# Patient Record
Sex: Male | Born: 1965 | Race: White | Hispanic: No | Marital: Married | Smoking: Never smoker
Health system: Southern US, Community
[De-identification: ages and names within clinical notes are randomized; demographics above are authoritative.]

## PROBLEM LIST (undated history)

## (undated) DIAGNOSIS — L98499 Non-pressure chronic ulcer of skin of other sites with unspecified severity: Secondary | ICD-10-CM

## (undated) DIAGNOSIS — K219 Gastro-esophageal reflux disease without esophagitis: Secondary | ICD-10-CM

## (undated) DIAGNOSIS — M1990 Unspecified osteoarthritis, unspecified site: Secondary | ICD-10-CM

## (undated) DIAGNOSIS — C44310 Basal cell carcinoma of skin of unspecified parts of face: Secondary | ICD-10-CM

## (undated) HISTORY — DX: Unspecified osteoarthritis, unspecified site: M19.90

## (undated) HISTORY — PX: ESOPHAGEAL DILATION: SHX303

## (undated) HISTORY — DX: Non-pressure chronic ulcer of skin of other sites with unspecified severity: L98.499

## (undated) HISTORY — DX: Basal cell carcinoma of skin of unspecified parts of face: C44.310

## (undated) HISTORY — PX: UPPER GASTROINTESTINAL ENDOSCOPY: SHX188

## (undated) HISTORY — PX: HERNIA REPAIR: SHX51

## (undated) HISTORY — DX: Gastro-esophageal reflux disease without esophagitis: K21.9

## (undated) HISTORY — PX: WISDOM TOOTH EXTRACTION: SHX21

---

## 2005-12-20 ENCOUNTER — Encounter: Admission: RE | Admit: 2005-12-20 | Discharge: 2005-12-20 | Payer: Self-pay | Admitting: Family Medicine

## 2006-01-21 ENCOUNTER — Ambulatory Visit (HOSPITAL_COMMUNITY): Admission: RE | Admit: 2006-01-21 | Discharge: 2006-01-21 | Payer: Self-pay | Admitting: Gastroenterology

## 2008-08-03 ENCOUNTER — Emergency Department (HOSPITAL_COMMUNITY): Admission: EM | Admit: 2008-08-03 | Discharge: 2008-08-03 | Payer: Self-pay | Admitting: *Deleted

## 2008-08-03 ENCOUNTER — Emergency Department (HOSPITAL_COMMUNITY): Admission: EM | Admit: 2008-08-03 | Discharge: 2008-08-03 | Payer: Self-pay | Admitting: Emergency Medicine

## 2009-01-04 IMAGING — CT CT ABDOMEN W/ CM
2 of 5 series · 17 of 46 positions shown, 19 images · IV contrast (agent unspecified)
Comparison: None available.

CT ABDOMEN

CLINICAL DATA: Abdominal pain, vomiting.  Right lower quadrant
pain and elevated white count.

CT ABDOMEN AND PELVIS WITH CONTRAST
TECHNIQUE: Multidetector CT imaging of the abdomen and pelvis was
performed using the standard protocol following bolus
administration of intravenous contrast.
Contrast: 100 ml 1mnipaque-WQQ.

[Series 2: abd_pel 5.0 b40f st · axial · 0.63mm/px · z∈[-608,-228]mm · 14 of 86 slices shown, 16 images]
[im 5/86  soft-tissue]
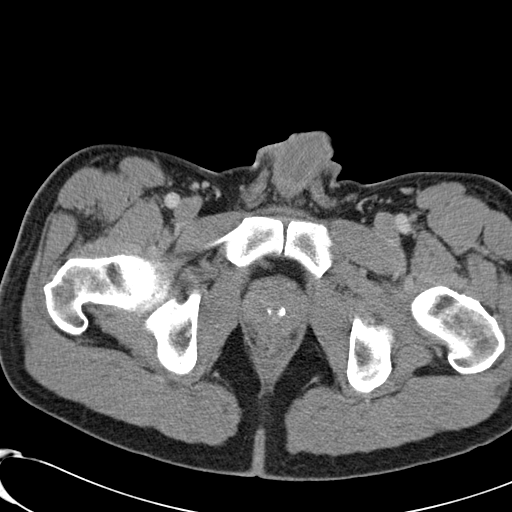
[im 5/86  bone]
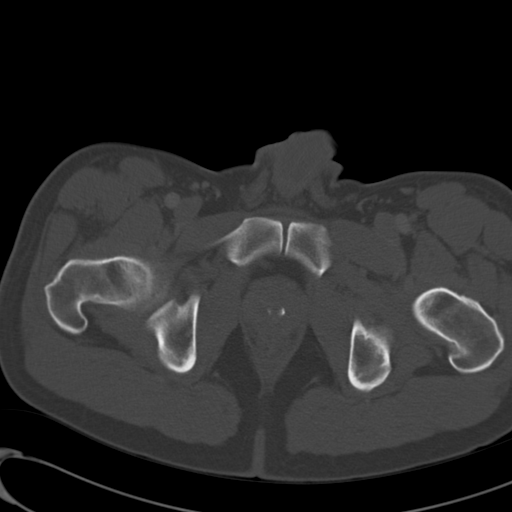
[im 9/86  soft-tissue]
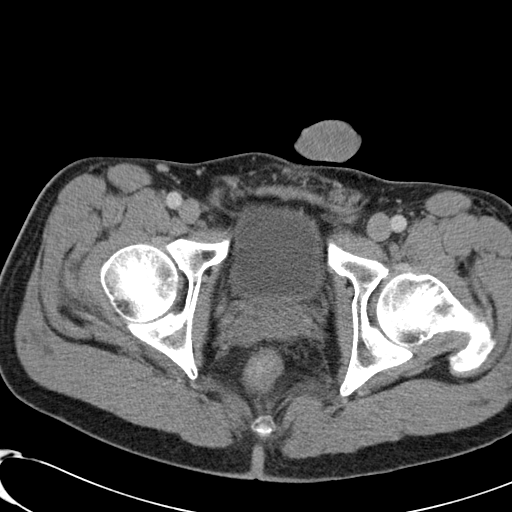
[im 18/86  soft-tissue]
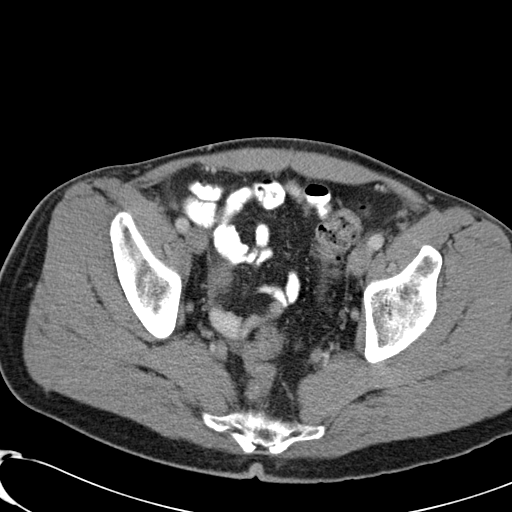
[im 23/86  soft-tissue]
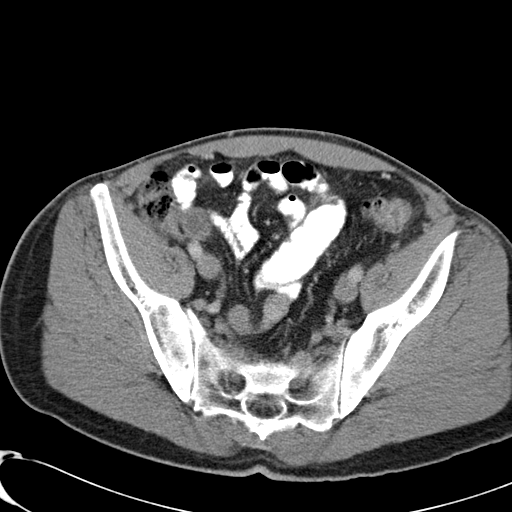
[im 27/86  soft-tissue]
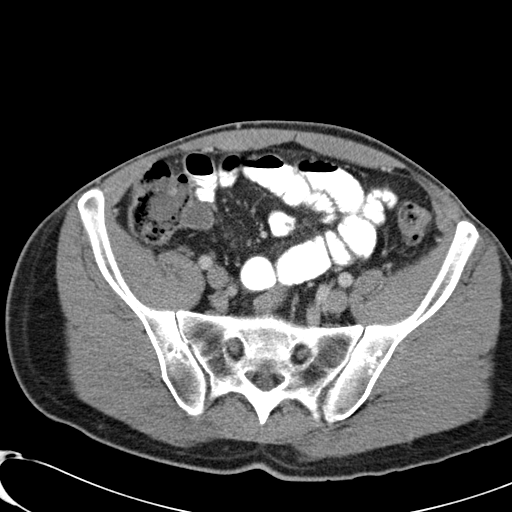
[im 36/86  soft-tissue]
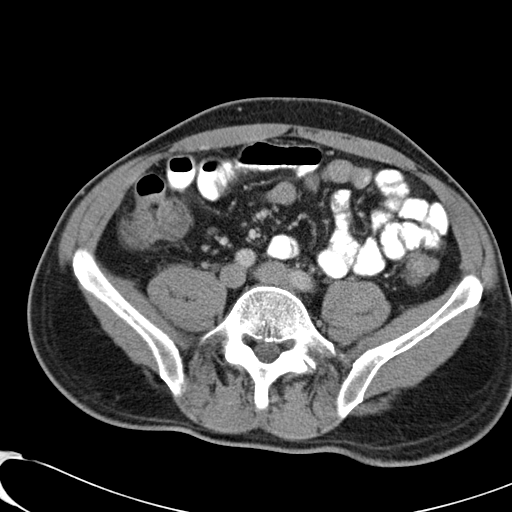
[im 41/86  soft-tissue]
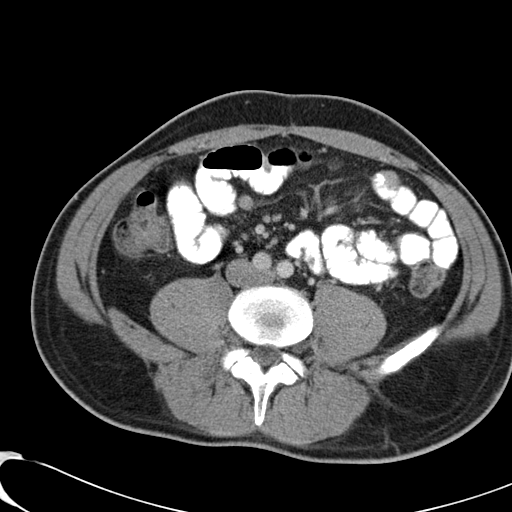
[im 45/86  soft-tissue]
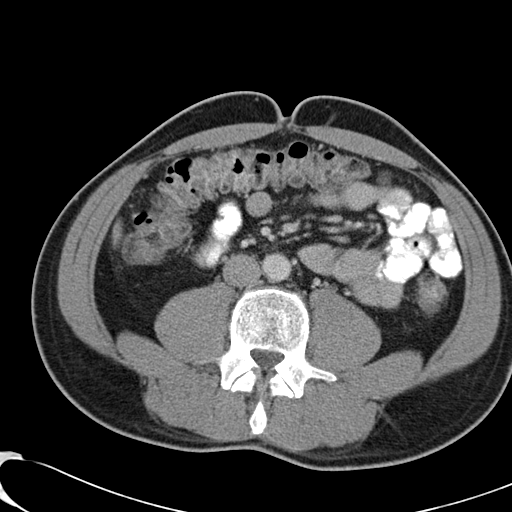
[im 50/86  soft-tissue]
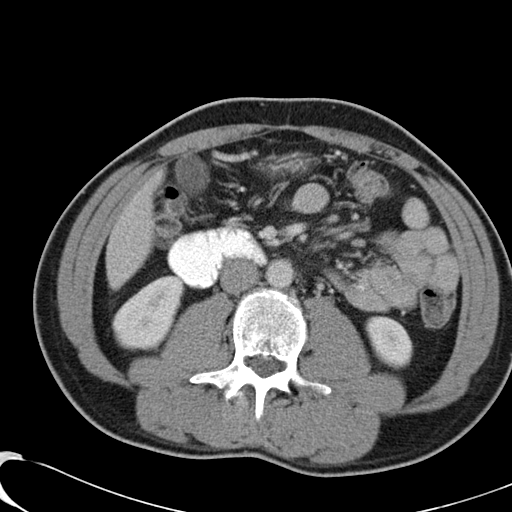
[im 50/86  bone]
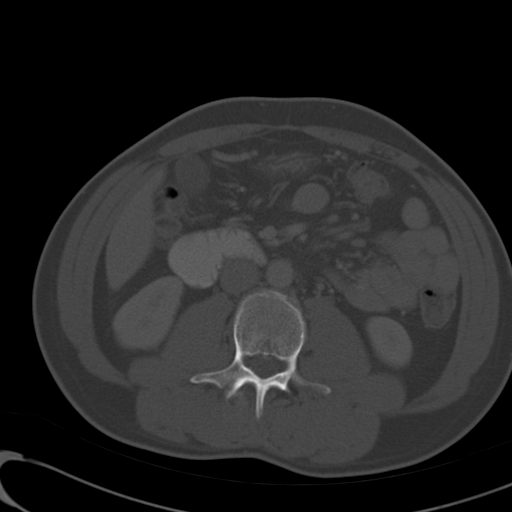
[im 59/86  soft-tissue]
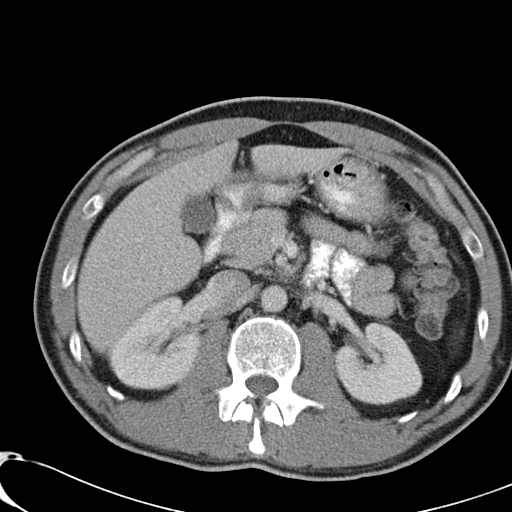
[im 63/86  soft-tissue]
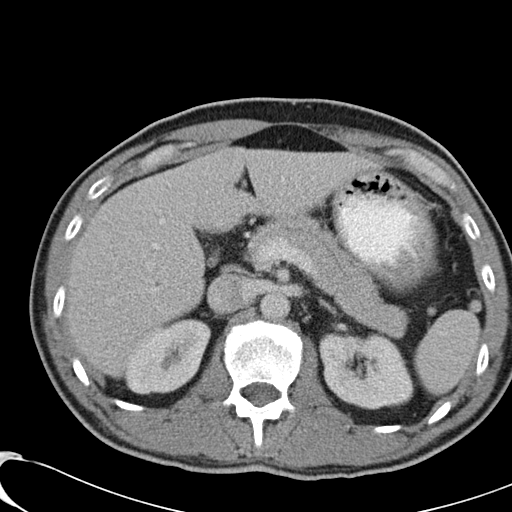
[im 68/86  soft-tissue]
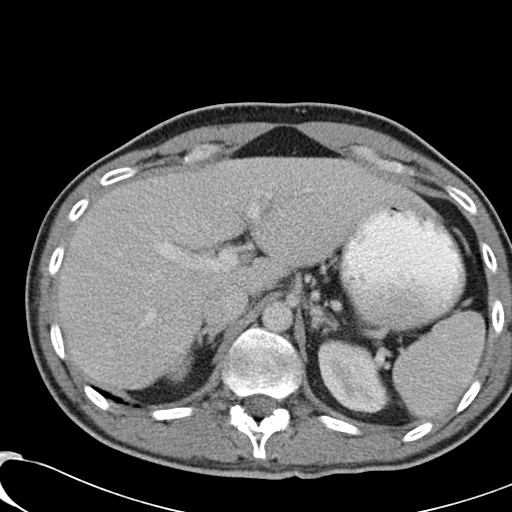
[im 77/86  soft-tissue]
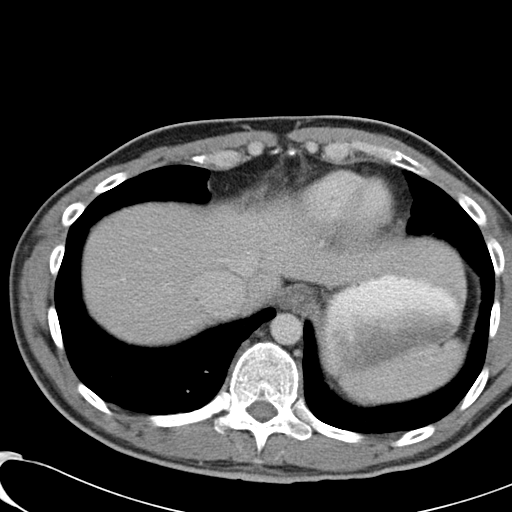
[im 81/86  soft-tissue]
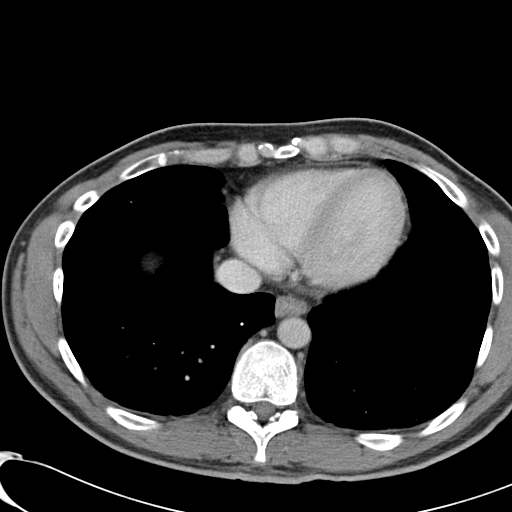

[Series 602: <mpr thick range> · coronal · 0.87mm/px · 3 of 67 slices shown]
[im 23/67  soft-tissue]
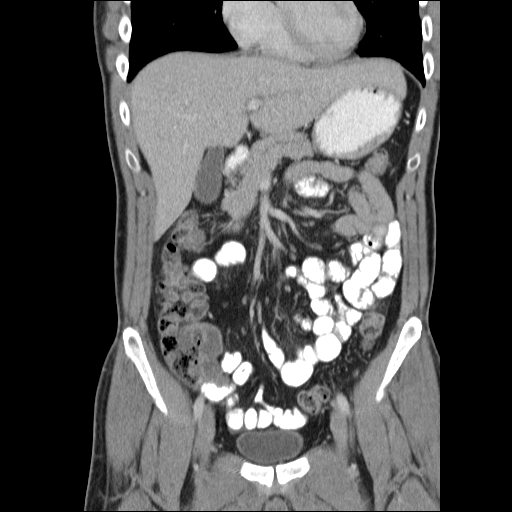
[im 30/67  soft-tissue]
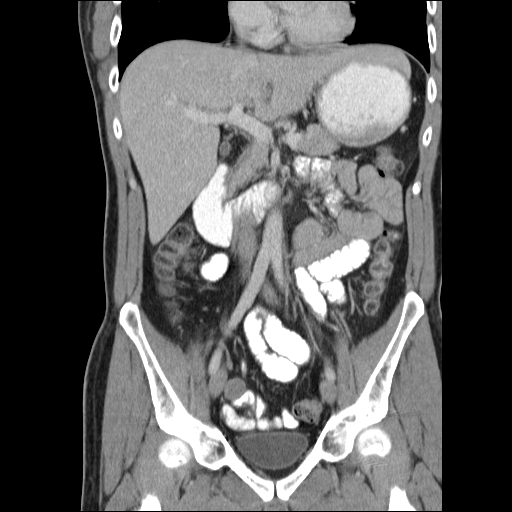
[im 37/67  soft-tissue]
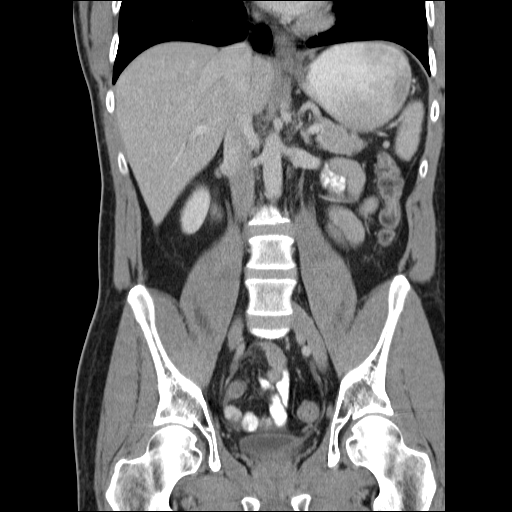

[17 of 46 positions shown; findings below may reference images not displayed]

FINDINGS: There is some dependent atelectatic change in the lung
bases. Calcified granuloma left lower lobe noted.  No pleural or
pericardial effusion.

The liver, gallbladder, biliary tree, adrenal glands, spleen,
pancreas and kidneys all appear normal.  Stomach and small bowel
are normal in appearance.  There is no abdominal lymphadenopathy or
fluid collection.  No focal bony abnormality.
IMPRESSION: Negative exam.

CT PELVIS
FINDINGS: The appendix is visualized and normal.  There is a
moderate volume of free fluid in the pelvis.  Source for this fluid
is not identified.  Colon is unremarkable in appearance.  No
lymphadenopathy.  Prostate calcifications noted.  Small sclerotic
lesion in the left iliac wing likely reflects a bone island.  Bones
otherwise unremarkable.
IMPRESSION: Free pelvic fluid is noted.  Source of fluid is not identified. If
the patient's symptoms persist, consider rescanning and 12-24
hours.

## 2011-05-10 NOTE — Op Note (Signed)
NAMEACHILLE, Dustin Fischer                 ACCOUNT NO.:  [id number]   MEDICAL RECORD NO.:  [id number]          PATIENT TYPE:  AMB   LOCATION:  ENDO                         FACILITY:  [facility]   PHYSICIAN:  Danise Edge, M.D.   DATE OF BIRTH:  May 03, 1966   DATE OF PROCEDURE:  01/21/2006  DATE OF DISCHARGE:                                 OPERATIVE REPORT   PROCEDURE:  Esophagogastroduodenoscopy and Savary esophageal dilation.   REFERRING PHYSICIAN:  Jethro Bastos, M.D.   INDICATIONS:  [name] is a 44 year old male born 28-Jan-1966. Mr.  Dustin Fischer has a history of peptic ulcer disease and was treated for H. pylori  antral gastritis. He takes Zantac p.r.n. for dyspepsia. 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   ENDOSCOPIST:  Danise Edge, M.D.   PREMEDICATION:  Versed 10 mg, Demerol 50 mg.   DESCRIPTION OF PROCEDURE:  After obtaining informed consent, [name] was  placed in the left lateral decubitus position on the fluoroscopy table. I  administered intravenous Demerol and intravenous Versed to achieve conscious  sedation for the procedure. The patient's blood pressure, oxygen saturation  and cardiac rhythm were monitored throughout the procedure and documented in  the medical record.   The Olympus gastroscope was passed through the posterior hypopharynx into  the proximal esophagus without difficulty. The hypopharynx, larynx and vocal  cords appeared completely normal.   ESOPHAGOSCOPY:  The proximal and mid segments of the esophageal mucosa  appeared normal. There is a benign appearing stricture at the  esophagogastric junction which is noted at approximately 44 cm from the  incisor teeth. Mucosa in the distal esophagus and overlying the distal  esophageal stricture  appeared normal. There was no endoscopic evidence for  the presence of erosive esophagitis or Barrett's esophagus. The stricture  was too tight to allow the gastroscope to traverse the stricture and enter  the stomach. Under fluoroscopic guidance, the guidewire was deployed at the  esophagogastric junction through the stricture and advanced to the distal  gastric antrum. Under fluoroscopic guidance, the 10 mm, 11 mm and 12.5 mm  dilators were passed. The guidewire was removed. The endoscope was again  passed through the posterior hypopharynx into the proximal esophagus without  difficulty. I was easily able to traverse the benign-appearing peptic  stricture at the esophagogastric junction. There was a linear tear at the  esophagogastric junction due to esophageal dilation. There was a small  amount of bleeding. The stricture appeared adequately dilated and due to the  presence of the linear tear I elected not to pass any further dilators.   GASTROSCOPY:  Retroflexed view of the gastric cardia and fundus was normal.  The gastric body, antrum and pylorus appeared normal. A biopsy was taken  from the distal gastric antrum for CLO-test to confirm eradication of the H.  pylori antral gastritis.   DUODENOSCOPY:  The duodenal bulb appeared normal. There  was mild resistance  to passage of the duodenoscope into the second portion of the duodenum,  probably due to a stricture from previous peptic ulcer disease. The duodenal  bulb and mucosa in the second portion of the duodenum appeared normal. There  was bleeding at the junction of the duodenal bulb and second portion of the  duodenum as a result of the gastroscope dilating the duodenal stricture.   ASSESSMENT:  1.  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 Due to esophageal dilation, a linear      tear was present at the esophagogastric junction and I elected not to       pass any further dilators during this setting.  2.  Previous peptic ulcer disease leading to a mild stricture at the distal      duodenal bulb which was partially dilated with the gastroscope as I      entered the second portion of the duodenum.  3.  CLO-test to rule out H. pylori gastritis pending.           ______________________________  Danise Edge, M.D.     MJ/MEDQ  D:  01/21/2006  T:  01/21/2006  Job:  [id number]   cc:   Jethro Bastos, M.D.  Fax: [phone]

## 2011-09-20 LAB — POCT I-STAT, CHEM 8
Calcium, Ion: 1.22
Chloride: 106
Creatinine, Ser: 1.1
Creatinine, Ser: 1.2
Glucose, Bld: 141 — ABNORMAL HIGH
Glucose, Bld: 86
HCT: 47
Hemoglobin: 14.3
Potassium: 8.5
Sodium: 142
TCO2: 30

## 2011-09-20 LAB — COMPREHENSIVE METABOLIC PANEL
ALT: 22
Alkaline Phosphatase: 52
CO2: 29
Glucose, Bld: 144 — ABNORMAL HIGH
Potassium: 3.5
Sodium: 142
Total Protein: 7.1

## 2011-09-20 LAB — CBC
HCT: 40.5
Hemoglobin: 13.3
MCHC: 32.9
MCV: 94.6
RBC: 4.28
RBC: 4.65
WBC: 17.8 — ABNORMAL HIGH

## 2011-09-20 LAB — DIFFERENTIAL
Lymphs Abs: 2
Monocytes Relative: 6
Neutro Abs: 14.5 — ABNORMAL HIGH
Neutrophils Relative %: 82 — ABNORMAL HIGH

## 2011-09-20 LAB — URINALYSIS, ROUTINE W REFLEX MICROSCOPIC
Glucose, UA: NEGATIVE
Leukocytes, UA: NEGATIVE
Protein, ur: NEGATIVE

## 2014-08-31 ENCOUNTER — Ambulatory Visit (INDEPENDENT_AMBULATORY_CARE_PROVIDER_SITE_OTHER): Payer: BC Managed Care – PPO | Admitting: Emergency Medicine

## 2014-08-31 VITALS — BP 120/84 | HR 57 | Temp 98.1°F | Resp 16 | Ht 73.0 in | Wt 171.2 lb

## 2014-08-31 DIAGNOSIS — S91309A Unspecified open wound, unspecified foot, initial encounter: Secondary | ICD-10-CM

## 2014-08-31 DIAGNOSIS — S91312A Laceration without foreign body, left foot, initial encounter: Secondary | ICD-10-CM

## 2014-08-31 DIAGNOSIS — Z23 Encounter for immunization: Secondary | ICD-10-CM

## 2014-08-31 NOTE — Progress Notes (Signed)
[facility] [address], [place] [address] [phone]  Date:  08/31/2014   Name:  EVYN PUTZIER   DOB:  09-14-1966   MRN:  [id number]  PCP:  No PCP Per Patient    Chief Complaint: Hernia   History of Present Illness:  NAVDEEP HALT is a 48 y.o. very pleasant male patient who presents with the following:  [place]  Has a laceration of foot. Concerned about two skin lesions on left arm.  Present for years Concerned that he has pain intermittently in his right groin.  Worse when runs.  No history of injury or overuse. No improvement with over the counter medications or other home remedies.  Denies other complaint or health concern today.  .   There are no active problems to display for this patient.   Past Medical History  Diagnosis Date  . Ulcer of abdomen wall     No past surgical history on file.  History  Substance Use Topics  . Smoking status: Never Smoker   . Smokeless tobacco: Never Used  . Alcohol Use: No    Family History  Problem Relation Age of Onset  . Heart failure Mother     Allergies  Allergen Reactions  . Aspirin     Internal bleeding as a child    Medication list has been reviewed and updated.  No current outpatient prescriptions on file prior to visit.   No current facility-administered medications on file prior to visit.    Review of Systems:  As per HPI, otherwise negative.   Physical Examination: Filed Vitals:   08/31/14 2042  BP: 120/84  Pulse: 57  Temp: 98.1 F (36.7 C)  Resp: 16   Filed Vitals:   08/31/14 2042  Height: 6\' 1"  (1.854 m)  Weight: 171 lb 3.2 oz (77.656 kg)   Body mass index is 22.59 kg/(m^2). Ideal Body Weight: Weight in (lb) to have BMI = 25: 189.1   GEN: WDWN, NAD, Non-toxic, Alert & Oriented x 3 HEENT: Atraumatic, Normocephalic.  Ears and Nose: No external deformity. EXTR: No clubbing/cyanosis/edema NEURO: Normal gait.  PSYCH:  Normally interactive. Conversant. Not depressed or anxious appearing.  Calm demeanor.  LEFT foot:  Superficial skin tear on foot  NATI  No FB GROIN:  No hernia    Assessment and Plan: Laceration foot TD  Signed,  Ellison Carwin, MD

## 2014-08-31 NOTE — Patient Instructions (Signed)

## 2015-11-21 ENCOUNTER — Encounter: Payer: Self-pay | Admitting: *Deleted

## 2015-11-21 ENCOUNTER — Emergency Department
Admission: EM | Admit: 2015-11-21 | Discharge: 2015-11-21 | Disposition: A | Discharge status: Home / Self Care | Payer: No Typology Code available for payment source | Source: Home / Self Care | Attending: Family Medicine | Admitting: Family Medicine

## 2015-11-21 DIAGNOSIS — J020 Streptococcal pharyngitis: Secondary | ICD-10-CM

## 2015-11-21 LAB — POCT RAPID STREP A (OFFICE): Rapid Strep A Screen: POSITIVE — AB

## 2015-11-21 MED ORDER — ACETAMINOPHEN 325 MG PO TABS
975.0000 mg | ORAL_TABLET | Freq: Once | ORAL | Status: AC
  Administered 2015-11-21: 975 mg via ORAL

## 2015-11-21 MED ORDER — CEFTRIAXONE SODIUM 1 G IJ SOLR
1.0000 g | Freq: Once | INTRAMUSCULAR | Status: AC
  Administered 2015-11-21: 1 g via INTRAMUSCULAR

## 2015-11-21 MED ORDER — AMOXICILLIN 400 MG/5ML PO SUSR: 500.0000 mg | Freq: Two times a day (BID) | ORAL | Status: DC

## 2015-11-21 NOTE — Discharge Instructions (Signed)
°  You may take 500mg Tylenol every 4-6 hours as needed for fever and pain  °Follow-up with your primary care provider next week for recheck of symptoms if not improving.  °Be sure to drink plenty of fluids and rest, at least 8hrs of sleep a night, preferably more while you are sick. °Return urgent care or go to closest ER if you cannot keep down fluids/signs of dehydration, fever not reducing with Tylenol, difficulty breathing/wheezing, stiff neck, worsening condition, or other concerns (see below)  °Please take antibiotics as prescribed and be sure to complete entire course even if you start to feel better to ensure infection does not come back. ° °

## 2015-11-21 NOTE — ED Notes (Signed)
Pt c/o sore throat, HA, and fever x 1 day. Took tylenol at 1300.

## 2015-11-21 NOTE — ED Provider Notes (Signed)
CSN: [id number]     Arrival date & time 11/21/15  1828 History   First MD Initiated Contact with Patient 11/21/15 1839     Chief Complaint  Patient presents with  . Sore Throat   (Consider location/radiation/quality/duration/timing/severity/associated sxs/prior Treatment) HPI Pt is a 49yo male presenting to [facility] with c/o gradually worsening sore throat with associated fatigue, fever Tmax 103.5 F, and body aches that started yesterday.  Pain is aching and sore, worse with swallowing. Pain is minimal at this time.  Denies sick contacts or recent travel. Denies difficulty breathing and is able to keep down fluids. Pt states he cannot take ibuprofen due to stomach ulcers.   Past Medical History  Diagnosis Date  . Ulcer of abdomen wall [facility])    Past Surgical History  Procedure Laterality Date  . Hernia repair    . Wisdom tooth extraction     Family History  Problem Relation Age of Onset  . Heart failure Mother    Social History  Substance Use Topics  . Smoking status: Never Smoker   . Smokeless tobacco: Never Used  . Alcohol Use: No    Review of Systems  Constitutional: Positive for fever, chills, activity change and fatigue.  HENT: Positive for sore throat. Negative for congestion, ear pain, trouble swallowing and voice change.   Respiratory: Positive for cough. Negative for shortness of breath.   Cardiovascular: Negative for chest pain and palpitations.  Gastrointestinal: Positive for nausea. Negative for vomiting, abdominal pain and diarrhea.  Musculoskeletal: Positive for myalgias and arthralgias. Negative for back pain.  Skin: Negative for rash.  Neurological: Positive for dizziness and headaches. Negative for light-headedness.  All other systems reviewed and are negative.   Allergies  Aspirin and Ibuprofen  Home Medications   Prior to Admission medications   Medication Sig Start Date End Date Taking? Authorizing Provider  amoxicillin (AMOXIL) 400 MG/5ML suspension  Take 6.3 mLs (500 mg total) by mouth 2 (two) times daily. For 10 days 11/21/15   Noland Fordyce, PA-C  omeprazole (PRILOSEC OTC) 20 MG tablet Take 20 mg by mouth daily.    Historical Provider, MD   Meds Ordered and Administered this Visit   Medications  cefTRIAXone (ROCEPHIN) injection 1 g (1 g Intramuscular Given 11/21/15 1910)  acetaminophen (TYLENOL) tablet 975 mg (975 mg Oral Given 11/21/15 1850)    BP 132/81 mmHg  Pulse 97  Temp(Src) 103 F (39.4 C) (Oral)  Resp 18  Ht 6\' 1"  (1.854 m)  Wt 173 lb (78.472 kg)  BMI 22.83 kg/m2  SpO2 97% No data found.   Physical Exam  Constitutional: He appears well-developed and well-nourished.  HENT:  Head: Normocephalic and atraumatic.  Right Ear: Hearing, tympanic membrane, external ear and ear canal normal.  Left Ear: Hearing, tympanic membrane, external ear and ear canal normal.  Nose: Nose normal.  Mouth/Throat: Uvula is midline and mucous membranes are normal. No trismus in the jaw. Oropharyngeal exudate, posterior oropharyngeal edema and posterior oropharyngeal erythema present. No tonsillar abscesses.  Eyes: Conjunctivae are normal. No scleral icterus.  Neck: Normal range of motion. Neck supple.  Cardiovascular: Normal rate, regular rhythm and normal heart sounds.   Pulmonary/Chest: Effort normal and breath sounds normal. No stridor. No respiratory distress. He has no wheezes. He has no rales. He exhibits no tenderness.  Abdominal: Soft. Bowel sounds are normal. He exhibits no distension and no mass. There is no tenderness. There is no rebound and no guarding.  Musculoskeletal: Normal range of motion.  Lymphadenopathy:    He has cervical adenopathy.  Neurological: He is alert.  Skin: Skin is warm and dry.  Nursing note and vitals reviewed.   ED Course  Procedures (including critical care time)  Labs Review Labs Reviewed  POCT RAPID STREP A (OFFICE) - Abnormal; Notable for the following:    Rapid Strep A Screen Positive (*)     All other components within normal limits    Imaging Review No results found.    MDM   1. Streptococcal pharyngitis    Pt c/o sore throat, fever, body aches and headache.  Tonsillar erythema, edema and exudates noted on exam. No peritonsillar abscess at this time. Rapid strep: POSITIVE Pt requesting "shot" of antibiotic in UC to start treatment as soon as possible Rx: Amoxicillin liquid form as pt states it hurts to swallow. F/u with PCP in 7-10 days if not improving, sooner if worsening. Patient verbalized understanding and agreement with treatment plan.     Noland Fordyce, PA-C 11/21/15 1916

## 2016-08-17 ENCOUNTER — Ambulatory Visit (INDEPENDENT_AMBULATORY_CARE_PROVIDER_SITE_OTHER): Payer: Managed Care, Other (non HMO) | Admitting: Family Medicine

## 2016-08-17 ENCOUNTER — Ambulatory Visit (INDEPENDENT_AMBULATORY_CARE_PROVIDER_SITE_OTHER): Payer: Managed Care, Other (non HMO)

## 2016-08-17 VITALS — BP 122/72 | HR 58 | Temp 98.0°F | Resp 17 | Ht 73.5 in | Wt 169.0 lb

## 2016-08-17 DIAGNOSIS — K219 Gastro-esophageal reflux disease without esophagitis: Secondary | ICD-10-CM | POA: Diagnosis not present

## 2016-08-17 DIAGNOSIS — Z1329 Encounter for screening for other suspected endocrine disorder: Secondary | ICD-10-CM

## 2016-08-17 DIAGNOSIS — C4491 Basal cell carcinoma of skin, unspecified: Secondary | ICD-10-CM | POA: Insufficient documentation

## 2016-08-17 DIAGNOSIS — Z1211 Encounter for screening for malignant neoplasm of colon: Secondary | ICD-10-CM

## 2016-08-17 DIAGNOSIS — M545 Low back pain, unspecified: Secondary | ICD-10-CM

## 2016-08-17 DIAGNOSIS — Z13220 Encounter for screening for lipoid disorders: Secondary | ICD-10-CM

## 2016-08-17 DIAGNOSIS — Z131 Encounter for screening for diabetes mellitus: Secondary | ICD-10-CM | POA: Diagnosis not present

## 2016-08-17 DIAGNOSIS — Z114 Encounter for screening for human immunodeficiency virus [HIV]: Secondary | ICD-10-CM | POA: Diagnosis not present

## 2016-08-17 DIAGNOSIS — Z0000 Encounter for general adult medical examination without abnormal findings: Secondary | ICD-10-CM

## 2016-08-17 DIAGNOSIS — H9191 Unspecified hearing loss, right ear: Secondary | ICD-10-CM

## 2016-08-17 DIAGNOSIS — Z136 Encounter for screening for cardiovascular disorders: Secondary | ICD-10-CM

## 2016-08-17 DIAGNOSIS — H9190 Unspecified hearing loss, unspecified ear: Secondary | ICD-10-CM | POA: Insufficient documentation

## 2016-08-17 DIAGNOSIS — Z125 Encounter for screening for malignant neoplasm of prostate: Secondary | ICD-10-CM

## 2016-08-17 LAB — POCT URINALYSIS DIP (MANUAL ENTRY)
Bilirubin, UA: NEGATIVE
GLUCOSE UA: NEGATIVE
Ketones, POC UA: NEGATIVE
Leukocytes, UA: NEGATIVE
NITRITE UA: NEGATIVE
RBC UA: NEGATIVE
SPEC GRAV UA: 1.02
UROBILINOGEN UA: 0.2
pH, UA: 7

## 2016-08-17 LAB — CBC WITH DIFFERENTIAL/PLATELET
BASOS ABS: 60 {cells}/uL (ref 0–200)
BASOS PCT: 1 %
EOS ABS: 120 {cells}/uL (ref 15–500)
Eosinophils Relative: 2 %
HEMATOCRIT: 44.1 % (ref 38.5–50.0)
Hemoglobin: 14.6 g/dL (ref 13.2–17.1)
Lymphocytes Relative: 40 %
Lymphs Abs: 2400 cells/uL (ref 850–3900)
MCH: 31.5 pg (ref 27.0–33.0)
MCHC: 33.1 g/dL (ref 32.0–36.0)
MCV: 95 fL (ref 80.0–100.0)
MONO ABS: 540 {cells}/uL (ref 200–950)
MONOS PCT: 9 %
MPV: 10.7 fL (ref 7.5–12.5)
NEUTROS ABS: 2880 {cells}/uL (ref 1500–7800)
Neutrophils Relative %: 48 %
Platelets: 273 10*3/uL (ref 140–400)
RBC: 4.64 MIL/uL (ref 4.20–5.80)
RDW: 13.4 % (ref 11.0–15.0)
WBC: 6 10*3/uL (ref 3.8–10.8)

## 2016-08-17 LAB — COMPREHENSIVE METABOLIC PANEL
ALBUMIN: 4.9 g/dL (ref 3.6–5.1)
ALK PHOS: 58 U/L (ref 40–115)
ALT: 11 U/L (ref 9–46)
AST: 15 U/L (ref 10–35)
BUN: 18 mg/dL (ref 7–25)
CALCIUM: 10.1 mg/dL (ref 8.6–10.3)
CHLORIDE: 105 mmol/L (ref 98–110)
CO2: 30 mmol/L (ref 20–31)
Creat: 1.06 mg/dL (ref 0.70–1.33)
Glucose, Bld: 100 mg/dL — ABNORMAL HIGH (ref 65–99)
POTASSIUM: 5.7 mmol/L — AB (ref 3.5–5.3)
Sodium: 143 mmol/L (ref 135–146)
TOTAL PROTEIN: 7.4 g/dL (ref 6.1–8.1)
Total Bilirubin: 0.6 mg/dL (ref 0.2–1.2)

## 2016-08-17 LAB — POC MICROSCOPIC URINALYSIS (UMFC)

## 2016-08-17 LAB — LIPID PANEL
CHOL/HDL RATIO: 2.9 ratio (ref <=5.0)
CHOLESTEROL: 160 mg/dL (ref 125–200)
HDL: 56 mg/dL (ref >=40)
LDL Cholesterol: 92 mg/dL (ref <130)
TRIGLYCERIDES: 60 mg/dL (ref <150)
VLDL: 12 mg/dL (ref <30)

## 2016-08-17 LAB — PSA: PSA: 0.4 ng/mL (ref <=4.0)

## 2016-08-17 LAB — HIV ANTIBODY (ROUTINE TESTING W REFLEX): HIV 1&2 Ab, 4th Generation: NONREACTIVE

## 2016-08-17 LAB — TSH: TSH: 3.14 mIU/L (ref 0.40–4.50)

## 2016-08-17 MED ORDER — METHOCARBAMOL 500 MG PO TABS: 500.0000 mg | ORAL_TABLET | Freq: Every day | ORAL | 2 refills | Status: DC

## 2016-08-17 NOTE — Patient Instructions (Addendum)
   IF you received an x-ray today, you will receive an invoice from [facility]. Please contact [facility] at [phone] with questions or concerns regarding your invoice.   IF you received labwork today, you will receive an invoice from [facility]. Please contact [facility] at [phone] with questions or concerns regarding your invoice.   Our billing staff will not be able to assist you with questions regarding bills from these companies.  You will be contacted with the lab results as soon as they are available. The fastest way to get your results is to activate your My Chart account. Instructions are located on the last page of this paperwork. If you have not heard from us regarding the results in 2 weeks, please contact this office.     Low Back Sprain With Rehab A sprain is an injury in which a ligament is torn. The ligaments of the lower back are vulnerable to sprains. However, they are strong and require great force to be injured. These ligaments are important for stabilizing the spinal column. Sprains are classified into three categories. Grade 1 sprains cause pain, but the tendon is not lengthened. Grade 2 sprains include a lengthened ligament, due to the ligament being stretched or partially ruptured. With grade 2 sprains there is still function, although the function may be decreased. Grade 3 sprains involve a complete tear of the tendon or muscle, and function is usually impaired. SYMPTOMS   Severe pain in the lower back.  Sometimes, a feeling of a "pop," "snap," or tear, at the time of injury.  Tenderness and sometimes swelling at the injury site.  Uncommonly, bruising (contusion) within 48 hours of injury.  Muscle spasms in the back. CAUSES  Low back sprains occur when a force is placed on the ligaments that is greater than they can handle. Common causes of injury include:  Performing a stressful act while  off-balance.  Repetitive stressful activities that involve movement of the lower back.  Direct hit (trauma) to the lower back. RISK INCREASES WITH:  Contact sports (football, wrestling).  Collisions (major skiing accidents).  Sports that require throwing or lifting (baseball, weightlifting).  Sports involving twisting of the spine (gymnastics, diving, tennis, golf).  Poor strength and flexibility.  Inadequate protection.  Previous back injury or surgery (especially fusion). PREVENTION  Wear properly fitted and padded protective equipment.  Warm up and stretch properly before activity.  Allow for adequate recovery between workouts.  Maintain physical fitness:  Strength, flexibility, and endurance.  Cardiovascular fitness.  Maintain a healthy body weight. PROGNOSIS  If treated properly, low back sprains usually heal with non-surgical treatment. The length of time for healing depends on the severity of the injury.  RELATED COMPLICATIONS   Recurring symptoms, resulting in a chronic problem.  Chronic inflammation and pain in the low back.  Delayed healing or resolution of symptoms, especially if activity is resumed too soon.  Prolonged impairment.  Unstable or arthritic joints of the low back. TREATMENT  Treatment first involves the use of ice and medicine, to reduce pain and inflammation. The use of strengthening and stretching exercises may help reduce pain with activity. These exercises may be performed at home or with a therapist. Severe injuries may require referral to a therapist for further evaluation and treatment, such as ultrasound. Your caregiver may advise that you wear a back brace or corset, to help reduce pain and discomfort. Often, prolonged bed rest results in greater harm then benefit. Corticosteroid injections may   be recommended. However, these should be reserved for the most serious cases. It is important to avoid using your back when lifting objects.  At night, sleep on your back on a firm mattress, with a pillow placed under your knees. If non-surgical treatment is unsuccessful, surgery may be needed.  MEDICATION   If pain medicine is needed, nonsteroidal anti-inflammatory medicines (aspirin and ibuprofen), or other minor pain relievers (acetaminophen), are often advised.  Do not take pain medicine for 7 days before surgery.  Prescription pain relievers may be given, if your caregiver thinks they are needed. Use only as directed and only as much as you need.  Ointments applied to the skin may be helpful.  Corticosteroid injections may be given by your caregiver. These injections should be reserved for the most serious cases, because they may only be given a certain number of times. HEAT AND COLD  Cold treatment (icing) should be applied for 10 to 15 minutes every 2 to 3 hours for inflammation and pain, and immediately after activity that aggravates your symptoms. Use ice packs or an ice massage.  Heat treatment may be used before performing stretching and strengthening activities prescribed by your caregiver, physical therapist, or athletic trainer. Use a heat pack or a warm water soak. SEEK MEDICAL CARE IF:   Symptoms get worse or do not improve in 2 to 4 weeks, despite treatment.  You develop numbness or weakness in either leg.  You lose bowel or bladder function.  Any of the following occur after surgery: fever, increased pain, swelling, redness, drainage of fluids, or bleeding in the affected area.  New, unexplained symptoms develop. (Drugs used in treatment may produce side effects.) EXERCISES  RANGE OF MOTION (ROM) AND STRETCHING EXERCISES - Low Back Sprain Most people with lower back pain will find that their symptoms get worse with excessive bending forward (flexion) or arching at the lower back (extension). The exercises that will help resolve your symptoms will focus on the opposite motion.  Your physician, physical  therapist or athletic trainer will help you determine which exercises will be most helpful to resolve your lower back pain. Do not complete any exercises without first consulting with your caregiver. Discontinue any exercises which make your symptoms worse, until you speak to your caregiver. If you have pain, numbness or tingling which travels down into your buttocks, leg or foot, the goal of the therapy is for these symptoms to move closer to your back and eventually resolve. Sometimes, these leg symptoms will get better, but your lower back pain may worsen. This is often an indication of progress in your rehabilitation. Be very alert to any changes in your symptoms and the activities in which you participated in the 24 hours prior to the change. Sharing this information with your caregiver will allow him or her to most efficiently treat your condition. These exercises may help you when beginning to rehabilitate your injury. Your symptoms may resolve with or without further involvement from your physician, physical therapist or athletic trainer. While completing these exercises, remember:   Restoring tissue flexibility helps normal motion to return to the joints. This allows healthier, less painful movement and activity.  An effective stretch should be held for at least 30 seconds.  A stretch should never be painful. You should only feel a gentle lengthening or release in the stretched tissue. FLEXION RANGE OF MOTION AND STRETCHING EXERCISES: STRETCH - Flexion, Single Knee to Chest   Lie on a firm bed or floor with   both legs extended in front of you.  Keeping one leg in contact with the floor, bring your opposite knee to your chest. Hold your leg in place by either grabbing behind your thigh or at your knee.  Pull until you feel a gentle stretch in your low back. Hold __________ seconds.  Slowly release your grasp and repeat the exercise with the opposite side. Repeat __________ times. Complete  this exercise __________ times per day.  STRETCH - Flexion, Double Knee to Chest  Lie on a firm bed or floor with both legs extended in front of you.  Keeping one leg in contact with the floor, bring your opposite knee to your chest.  Tense your stomach muscles to support your back and then lift your other knee to your chest. Hold your legs in place by either grabbing behind your thighs or at your knees.  Pull both knees toward your chest until you feel a gentle stretch in your low back. Hold __________ seconds.  Tense your stomach muscles and slowly return one leg at a time to the floor. Repeat __________ times. Complete this exercise __________ times per day.  STRETCH - Low Trunk Rotation  Lie on a firm bed or floor. Keeping your legs in front of you, bend your knees so they are both pointed toward the ceiling and your feet are flat on the floor.  Extend your arms out to the side. This will stabilize your upper body by keeping your shoulders in contact with the floor.  Gently and slowly drop both knees together to one side until you feel a gentle stretch in your low back. Hold for __________ seconds.  Tense your stomach muscles to support your lower back as you bring your knees back to the starting position. Repeat the exercise to the other side. Repeat __________ times. Complete this exercise __________ times per day  EXTENSION RANGE OF MOTION AND FLEXIBILITY EXERCISES: STRETCH - Extension, Prone on Elbows   Lie on your stomach on the floor, a bed will be too soft. Place your palms about shoulder width apart and at the height of your head.  Place your elbows under your shoulders. If this is too painful, stack pillows under your chest.  Allow your body to relax so that your hips drop lower and make contact more completely with the floor.  Hold this position for __________ seconds.  Slowly return to lying flat on the floor. Repeat __________ times. Complete this exercise  __________ times per day.  RANGE OF MOTION - Extension, Prone Press Ups  Lie on your stomach on the floor, a bed will be too soft. Place your palms about shoulder width apart and at the height of your head.  Keeping your back as relaxed as possible, slowly straighten your elbows while keeping your hips on the floor. You may adjust the placement of your hands to maximize your comfort. As you gain motion, your hands will come more underneath your shoulders.  Hold this position __________ seconds.  Slowly return to lying flat on the floor. Repeat __________ times. Complete this exercise __________ times per day.  RANGE OF MOTION- Quadruped, Neutral Spine   Assume a hands and knees position on a firm surface. Keep your hands under your shoulders and your knees under your hips. You may place padding under your knees for comfort.  Drop your head and point your tailbone toward the ground below you. This will round out your lower back like an angry cat. Hold this position   for __________ seconds.  Slowly lift your head and release your tail bone so that your back sags into a large arch, like an old horse.  Hold this position for __________ seconds.  Repeat this until you feel limber in your low back.  Now, find your "sweet spot." This will be the most comfortable position somewhere between the two previous positions. This is your neutral spine. Once you have found this position, tense your stomach muscles to support your low back.  Hold this position for __________ seconds. Repeat __________ times. Complete this exercise __________ times per day.  STRENGTHENING EXERCISES - Low Back Sprain These exercises may help you when beginning to rehabilitate your injury. These exercises should be done near your "sweet spot." This is the neutral, low-back arch, somewhere between fully rounded and fully arched, that is your least painful position. When performed in this safe range of motion, these exercises  can be used for people who have either a flexion or extension based injury. These exercises may resolve your symptoms with or without further involvement from your physician, physical therapist or athletic trainer. While completing these exercises, remember:   Muscles can gain both the endurance and the strength needed for everyday activities through controlled exercises.  Complete these exercises as instructed by your physician, physical therapist or athletic trainer. Increase the resistance and repetitions only as guided.  You may experience muscle soreness or fatigue, but the pain or discomfort you are trying to eliminate should never worsen during these exercises. If this pain does worsen, stop and make certain you are following the directions exactly. If the pain is still present after adjustments, discontinue the exercise until you can discuss the trouble with your caregiver. STRENGTHENING - Deep Abdominals, Pelvic Tilt   Lie on a firm bed or floor. Keeping your legs in front of you, bend your knees so they are both pointed toward the ceiling and your feet are flat on the floor.  Tense your lower abdominal muscles to press your low back into the floor. This motion will rotate your pelvis so that your tail bone is scooping upwards rather than pointing at your feet or into the floor. With a gentle tension and even breathing, hold this position for __________ seconds. Repeat __________ times. Complete this exercise __________ times per day.  STRENGTHENING - Abdominals, Crunches   Lie on a firm bed or floor. Keeping your legs in front of you, bend your knees so they are both pointed toward the ceiling and your feet are flat on the floor. Cross your arms over your chest.  Slightly tip your chin down without bending your neck.  Tense your abdominals and slowly lift your trunk high enough to just clear your shoulder blades. Lifting higher can put excessive stress on the lower back and does not  further strengthen your abdominal muscles.  Control your return to the starting position. Repeat __________ times. Complete this exercise __________ times per day.  STRENGTHENING - Quadruped, Opposite UE/LE Lift   Assume a hands and knees position on a firm surface. Keep your hands under your shoulders and your knees under your hips. You may place padding under your knees for comfort.  Find your neutral spine and gently tense your abdominal muscles so that you can maintain this position. Your shoulders and hips should form a rectangle that is parallel with the floor and is not twisted.  Keeping your trunk steady, lift your right hand no higher than your shoulder and then your left   leg no higher than your hip. Make sure you are not holding your breath. Hold this position for __________ seconds.  Continuing to keep your abdominal muscles tense and your back steady, slowly return to your starting position. Repeat with the opposite arm and leg. Repeat __________ times. Complete this exercise __________ times per day.  STRENGTHENING - Abdominals and Quadriceps, Straight Leg Raise   Lie on a firm bed or floor with both legs extended in front of you.  Keeping one leg in contact with the floor, bend the other knee so that your foot can rest flat on the floor.  Find your neutral spine, and tense your abdominal muscles to maintain your spinal position throughout the exercise.  Slowly lift your straight leg off the floor about 6 inches for a count of 15, making sure to not hold your breath.  Still keeping your neutral spine, slowly lower your leg all the way to the floor. Repeat this exercise with each leg __________ times. Complete this exercise __________ times per day. POSTURE AND BODY MECHANICS CONSIDERATIONS - Low Back Sprain Keeping correct posture when sitting, standing or completing your activities will reduce the stress put on different body tissues, allowing injured tissues a chance to heal  and limiting painful experiences. The following are general guidelines for improved posture. Your physician or physical therapist will provide you with any instructions specific to your needs. While reading these guidelines, remember:  The exercises prescribed by your provider will help you have the flexibility and strength to maintain correct postures.  The correct posture provides the best environment for your joints to work. All of your joints have less wear and tear when properly supported by a spine with good posture. This means you will experience a healthier, less painful body.  Correct posture must be practiced with all of your activities, especially prolonged sitting and standing. Correct posture is as important when doing repetitive low-stress activities (typing) as it is when doing a single heavy-load activity (lifting). RESTING POSITIONS Consider which positions are most painful for you when choosing a resting position. If you have pain with flexion-based activities (sitting, bending, stooping, squatting), choose a position that allows you to rest in a less flexed posture. You would want to avoid curling into a fetal position on your side. If your pain worsens with extension-based activities (prolonged standing, working overhead), avoid resting in an extended position such as sleeping on your stomach. Most people will find more comfort when they rest with their spine in a more neutral position, neither too rounded nor too arched. Lying on a non-sagging bed on your side with a pillow between your knees, or on your back with a pillow under your knees will often provide some relief. Keep in mind, being in any one position for a prolonged period of time, no matter how correct your posture, can still lead to stiffness. PROPER SITTING POSTURE In order to minimize stress and discomfort on your spine, you must sit with correct posture. Sitting with good posture should be effortless for a healthy body.  Returning to good posture is a gradual process. Many people can work toward this most comfortably by using various supports until they have the flexibility and strength to maintain this posture on their own. When sitting with proper posture, your ears will fall over your shoulders and your shoulders will fall over your hips. You should use the back of the chair to support your upper back. Your lower back will be in a neutral   position, just slightly arched. You may place a small pillow or folded towel at the base of your lower back for  support.  When working at a desk, create an environment that supports good, upright posture. Without extra support, muscles tire, which leads to excessive strain on joints and other tissues. Keep these recommendations in mind: CHAIR:  A chair should be able to slide under your desk when your back makes contact with the back of the chair. This allows you to work closely.  The chair's height should allow your eyes to be level with the upper part of your monitor and your hands to be slightly lower than your elbows. BODY POSITION  Your feet should make contact with the floor. If this is not possible, use a foot rest.  Keep your ears over your shoulders. This will reduce stress on your neck and low back. INCORRECT SITTING POSTURES  If you are feeling tired and unable to assume a healthy sitting posture, do not slouch or slump. This puts excessive strain on your back tissues, causing more damage and pain. Healthier options include:  Using more support, like a lumbar pillow.  Switching tasks to something that requires you to be upright or walking.  Talking a brief walk.  Lying down to rest in a neutral-spine position. PROLONGED STANDING WHILE SLIGHTLY LEANING FORWARD  When completing a task that requires you to lean forward while standing in one place for a long time, place either foot up on a stationary 2-4 inch high object to help maintain the best posture. When  both feet are on the ground, the lower back tends to lose its slight inward curve. If this curve flattens (or becomes too large), then the back and your other joints will experience too much stress, tire more quickly, and can cause pain. CORRECT STANDING POSTURES Proper standing posture should be assumed with all daily activities, even if they only take a few moments, like when brushing your teeth. As in sitting, your ears should fall over your shoulders and your shoulders should fall over your hips. You should keep a slight tension in your abdominal muscles to brace your spine. Your tailbone should point down to the ground, not behind your body, resulting in an over-extended swayback posture.  INCORRECT STANDING POSTURES  Common incorrect standing postures include a forward head, locked knees and/or an excessive swayback. WALKING Walk with an upright posture. Your ears, shoulders and hips should all line-up. PROLONGED ACTIVITY IN A FLEXED POSITION When completing a task that requires you to bend forward at your waist or lean over a low surface, try to find a way to stabilize 3 out of 4 of your limbs. You can place a hand or elbow on your thigh or rest a knee on the surface you are reaching across. This will provide you more stability, so that your muscles do not tire as quickly. By keeping your knees relaxed, or slightly bent, you will also reduce stress across your lower back. CORRECT LIFTING TECHNIQUES DO :  Assume a wide stance. This will provide you more stability and the opportunity to get as close as possible to the object which you are lifting.  Tense your abdominals to brace your spine. Bend at the knees and hips. Keeping your back locked in a neutral-spine position, lift using your leg muscles. Lift with your legs, keeping your back straight.  Test the weight of unknown objects before attempting to lift them.  Try to keep your elbows locked down   down at your sides in order get the best  strength from your shoulders when carrying an object.  Always ask for help when lifting heavy or awkward objects. INCORRECT LIFTING TECHNIQUES DO NOT:   Lock your knees when lifting, even if it is a small object.  Bend and twist. Pivot at your feet or move your feet when needing to change directions.  Assume that you can safely pick up even a paperclip without proper posture.   This information is not intended to replace advice given to you by your health care provider. Make sure you discuss any questions you have with your health care provider.   Document Released: 12/09/2005 Document Revised: 12/30/2014 Document Reviewed: 03/23/2009 Elsevier Interactive Patient Education 2016 [place] you healthy  Get these tests  Blood pressure- Have your blood pressure checked once a year by your healthcare provider.  Normal blood pressure is 120/80  Weight- Have your body mass index (BMI) calculated to screen for obesity.  BMI is a measure of body fat based on height and weight. You can also calculate your own BMI at [email or web address].  Cholesterol- Have your cholesterol checked every year.  Diabetes- Have your blood sugar checked regularly if you have high blood pressure, high cholesterol, have a family history of diabetes or if you are overweight.  Screening for Colon Cancer- Colonoscopy starting at age 82.  Screening may begin sooner depending on your family history and other health conditions. Follow up colonoscopy as directed by your Gastroenterologist.  Screening for Prostate Cancer- Both blood work (PSA) and a rectal exam help screen for Prostate Cancer.  Screening begins at age 22 with African-American men and at age 67 with Caucasian men.  Screening may begin sooner depending on your family history.  Take these medicines  Aspirin- One aspirin daily can help prevent Heart disease and Stroke.  Flu shot- Every fall.  Tetanus- Every 10 years.  Zostavax- Once  after the age of 19 to prevent Shingles.  Pneumonia shot- Once after the age of 9; if you are younger than 63, ask your healthcare provider if you need a Pneumonia shot.  Take these steps  Don't smoke- If you do smoke, talk to your doctor about quitting.  For tips on how to quit, go to www.smokefree.gov or call 1-800-QUIT-NOW.  Be physically active- Exercise 5 days a week for at least 30 minutes.  If you are not already physically active start slow and gradually work up to 30 minutes of moderate physical activity.  Examples of moderate activity include walking briskly, mowing the yard, dancing, swimming, bicycling, etc.  Eat a healthy diet- Eat a variety of healthy food such as fruits, vegetables, low fat milk, low fat cheese, yogurt, lean meant, poultry, fish, beans, tofu, etc. For more information go to www.thenutritionsource.org  Drink alcohol in moderation- Limit alcohol intake to less than two drinks a day. Never drink and drive.  Dentist- Brush and floss twice daily; visit your dentist twice a year.  Depression- Your emotional health is as important as your physical health. If you're feeling down, or losing interest in things you would normally enjoy please talk to your healthcare provider.  Eye exam- Visit your eye doctor every year.  Safe sex- If you may be exposed to a sexually transmitted infection, use a condom.  Seat belts- Seat belts can save your life; always wear one.  Smoke/Carbon Monoxide detectors- These detectors need to be installed on the appropriate level of your home.  Replace batteries  at least once a year.  Skin cancer- When out in the sun, cover up and use sunscreen 15 SPF or higher.  Violence- If anyone is threatening you, please tell your healthcare provider.  Living Will/ Health care power of attorney- Speak with your healthcare provider and family.

## 2016-08-17 NOTE — Progress Notes (Signed)
Subjective:  By signing my name below, I, Dustin Fischer, attest that this documentation has been prepared under the direction and in the presence of Dustin Forts, MD.  Electronically Signed: Thea Fischer, ED Scribe. 08/17/2016. 3:08 PM.   Patient ID: Dustin Fischer, male    DOB: 09-04-1966, 50 y.o.   MRN: [id number]  08/17/2016  Annual Exam  HPI  Dustin Fischer is a 50 y.o. male who presents to the [facility] for a physical.    Health maintenance Colonoscopy- pt agrees to schedule this.  Prostate- pt agrees to prostate exam today. He notes having a weaker urine stream.  Immunization Immunization History  Administered Date(s) Administered  . Tdap 08/31/2014  flu shot- pt declines flu shot   Exercise  Pt runs about 3-4 miles about 3-4 times week. Pt states he does not have the energy that he used to run. He believes this is due to age.    Depression Depression screen Dustin Fischer 2/9 08/17/2016  Decreased Interest 0  Down, Depressed, Hopeless 0  PHQ - 2 Score 0    Vision  Visual Acuity Screening   Right eye Left eye Both eyes  Without correction: 20/40 20/25 20/25   With correction:      Pt has noticed a decrease in vision. He has eye exam every two years.  Dentist He is seen every 6 months   Hearing Pt reports occasional tinnitus. States his father has poor hearing. Has hearing aide but does not wear all the time; lost hearing aide recently.  Family hx  Mother: diagnosed with cardiomegally had pace maker defibrillator placed in mid 57. She is doing well and is now 70. Hx of HLD Father: 49, hx of HLD.  Brother: healthy   Social hx  Pt has been married for 28 years. He has 3 children. No grandchild. He lives at home with his wife. He works in [employer] and has been doing this for 27 years. Pt has never been a smoker. No alcohol. Pt wears seatbelt every time, he has text while driving.  Sleep  Pt goes to bed at 11pm and wakes up at 6. He  occasionally wakes up during the night due to back discomfort.   Back pain  Pt reports occasional back pain on and off for several year but pain has become more consistent. He reports pain at night while sleeping. He had imaging around age 28. No radiation into legs; no n/t/w. No saddle paresthesias; no b/b dysfunction.  Hx of stomach ulcer He is on Prilosec daily. Pt states he was diagnosed with stomach ulcer in 7th grade. At that time he was started on zantac but later changed to Prilosec. He has tried coming off medication in the past but ulcer would flare. He is aware of effects with long term use. He notes having esophagus stretched in the past and can feel that it has tightened.   Bowel movements Pt has BM daily. He denies trouble with constipation, diarrhea, dark and tarry stools  nausea, emesis, night sweats and weight loss.   Review of Systems  Constitutional: Negative for activity change, appetite change, chills, diaphoresis, fatigue, fever and unexpected weight change.  HENT: Positive for hearing loss. Negative for congestion, dental problem, drooling, ear discharge, ear pain, facial swelling, mouth sores, nosebleeds, postnasal drip, rhinorrhea, sinus pressure, sneezing, sore throat, tinnitus, trouble swallowing and voice change.   Eyes: Negative for photophobia, pain, discharge, redness, itching and visual disturbance.  Respiratory: Negative for apnea, cough, choking, chest tightness, shortness of breath, wheezing and stridor.   Cardiovascular: Negative for chest pain, palpitations and leg swelling.  Gastrointestinal: Negative for abdominal pain, blood in stool, constipation, diarrhea, nausea and vomiting.  Endocrine: Negative for cold intolerance, heat intolerance, polydipsia, polyphagia and polyuria.  Genitourinary: Negative for decreased urine volume, difficulty urinating, discharge, dysuria, enuresis, flank pain, frequency, genital sores, hematuria, penile pain, penile swelling,  scrotal swelling, testicular pain and urgency.  Musculoskeletal: Positive for back pain. Negative for arthralgias, gait problem, joint swelling, myalgias, neck pain and neck stiffness.  Skin: Negative for color change, pallor, rash and wound.  Allergic/Immunologic: Negative for environmental allergies, food allergies and immunocompromised state.  Neurological: Negative for dizziness, tremors, seizures, syncope, facial asymmetry, speech difficulty, weakness, light-headedness, numbness and headaches.  Hematological: Negative for adenopathy. Does not bruise/bleed easily.  Psychiatric/Behavioral: Negative for agitation, behavioral problems, confusion, decreased concentration, dysphoric mood, hallucinations, self-injury, sleep disturbance and suicidal ideas. The patient is not nervous/anxious and is not hyperactive.     Past Medical History:  Diagnosis Date  . Basal cell carcinoma, face   . GERD (gastroesophageal reflux disease)   . Ulcer of abdomen wall ([place])    childhood PUD   Past Surgical History:  Procedure Laterality Date  . ESOPHAGEAL DILATION    . HERNIA REPAIR    . WISDOM TOOTH EXTRACTION     Allergies  Allergen Reactions  . Aspirin     Internal bleeding as a child  . Ibuprofen     Social History   Social History  . Marital status: Married    Spouse name: N/A  . Number of children: N/A  . Years of education: N/A   Occupational History  . Not on file.   Social History Main Topics  . Smoking status: Never Smoker  . Smokeless tobacco: Never Used  . Alcohol use No  . Drug use: No  . Sexual activity: No   Other Topics Concern  . Not on file   Social History Narrative   Marital status: married x 28 years      Children: 3 children; no grandchildren      Lives: with wife      Employment:  Sales x 27 years; Education officer, [employer]; tree [employer]. Mostly [place] travel      Tobacco: none       Alcohol: none       Exercises: jogging 3-4 miles 3-4 times per week       Seatbelt: 100%; some texting while driving                Family History  Problem Relation Age of Onset  . Heart failure Mother   . Hyperlipidemia Mother   . Heart disease Mother 2    pacemaker/defibrillator  . Hyperlipidemia Father        Objective:    BP 122/72 (BP Location: Right Arm, Patient Position: Sitting, Cuff Size: Normal)   Pulse (!) 58   Temp 98 F (36.7 C) (Oral)   Resp 17   Ht 6' 1.5" (1.867 m)   Wt 169 lb (76.7 kg)   SpO2 100%   BMI 21.99 kg/m  Physical Exam  Constitutional: He is oriented to person, place, and time. He appears well-developed and well-nourished. No distress.  HENT:  Head: Normocephalic and atraumatic.  Right Ear: External ear normal.  Left Ear: External ear normal.  Nose: Nose normal.  Mouth/Throat: Oropharynx is clear and moist.  Eyes: Conjunctivae and  EOM are normal. Pupils are equal, round, and reactive to light.  Neck: Normal range of motion. Neck supple. Carotid bruit is not present. No thyromegaly present.  Cardiovascular: Normal rate, regular rhythm, normal heart sounds and intact distal pulses.  Exam reveals no gallop and no friction rub.   No murmur heard. Pulmonary/Chest: Effort normal and breath sounds normal. He has no wheezes. He has no rales.  Abdominal: Soft. Bowel sounds are normal. He exhibits no distension and no mass. There is no tenderness. There is no rebound and no guarding. Hernia confirmed negative in the right inguinal area and confirmed negative in the left inguinal area.  Genitourinary: Rectum normal, prostate normal, testes normal and penis normal. Right testis shows no mass, no swelling and no tenderness. Left testis shows no mass, no swelling and no tenderness. Circumcised.  Musculoskeletal:       Right shoulder: Normal.       Left shoulder: Normal.       Cervical back: Normal.  Lymphadenopathy:    He has no cervical adenopathy.       Right: No inguinal adenopathy present.       Left: No inguinal adenopathy  present.  Neurological: He is alert and oriented to person, place, and time. He has normal reflexes. No cranial nerve deficit. He exhibits normal muscle tone. Coordination normal.  Skin: Skin is warm and dry. No rash noted. He is not diaphoretic.  Diffuse sun related changes along face and upper torso.  Psychiatric: He has a normal mood and affect. His behavior is normal. Judgment and thought content normal.   Results for orders placed or performed in visit on 08/17/16  POCT urinalysis dipstick  Result Value Ref Range   Color, UA yellow yellow   Clarity, UA clear clear   Glucose, UA negative negative   Bilirubin, UA negative negative   Ketones, POC UA negative negative   Spec Grav, UA 1.020    Blood, UA negative negative   pH, UA 7.0    Protein Ur, POC trace (A) negative   Urobilinogen, UA 0.2    Nitrite, UA Negative Negative   Leukocytes, UA Negative Negative  POCT Microscopic Urinalysis ([facility])  Result Value Ref Range   WBC,UR,HPF,POC None None WBC/hpf   RBC,UR,HPF,POC None None RBC/hpf   Bacteria Few (A) None, Too numerous to count   Mucus Present (A) Absent   Epithelial Cells, UR Per Microscopy Few (A) None, Too numerous to count cells/hpf   Amorphous Fischer    EKG-  Bradycardic   Dg Lumbar Spine Complete  Result Date: 08/17/2016 CLINICAL DATA:  Low back pain, chronic, worsening lately EXAM: LUMBAR SPINE - COMPLETE 4+ VIEW COMPARISON:  04/09/2010 FINDINGS: Degenerative spurring in the upper lumbar spine and lower thoracic spine. Disc spaces are maintained. No fracture. SI joints are symmetric and unremarkable. IMPRESSION: Mild degenerative spurring.  No acute bony abnormality. Electronically Signed   By: Rolm Baptise M.D.   On: 08/17/2016 11:43   Assessment & Plan:   1. Routine physical examination   2. Screening for diabetes mellitus   3. Screening, lipid   4. Screening for HIV (human immunodeficiency virus)   5. Screening for prostate cancer   6. Screening for thyroid  disorder   7. Screening for cardiovascular condition   8. Colon cancer screening   9. Bilateral low back pain without sciatica   10. Gastroesophageal reflux disease without esophagitis   11. Hearing loss, right   12. Basal cell carcinoma    -  anticipatory guidance -obtain screening labs. -refer to GI for colonoscopy -rx for Robaxin provided to take qhs for chronic lower back pain; home exercise program also provided. If no improvement in 2-4 weeks, call for ortho referral. -continue Prilosec for GERD; history of esophageal stricture. -recommend dermatology evaluation this year.  Orders Placed This Encounter  Procedures  . DG Lumbar Spine Complete    Standing Status:   Future    Number of Occurrences:   1    Standing Expiration Date:   08/17/2017    Order Specific Question:   Reason for Exam (SYMPTOM  OR DIAGNOSIS REQUIRED)    Answer:   LOW BACK PAIN FOR 20 YEARS WITH RECENT WORSENING; NO RADICULAR SYMPTOMS.    Order Specific Question:   Preferred imaging location?    Answer:   External  . CBC with Differential/Platelet  . Comprehensive metabolic panel    Order Specific Question:   Has the patient fasted?    Answer:   Yes  . Lipid panel    Order Specific Question:   Has the patient fasted?    Answer:   Yes  . HIV antibody  . PSA  . TSH  . Hemoglobin A1c  . Ambulatory referral to Gastroenterology    Referral Priority:   Routine    Referral Type:   Consultation    Referral Reason:   Specialty Services Required    Number of Visits Requested:   1  . POCT urinalysis dipstick  . POCT Microscopic Urinalysis ([facility])  . EKG 12-Lead   Meds ordered this encounter  Medications  . methocarbamol (ROBAXIN) 500 MG tablet    Sig: Take 1 tablet (500 mg total) by mouth at bedtime.    Dispense:  30 tablet    Refill:  2    No Follow-up on file.  I personally performed the services described in this documentation, which was scribed in my presence. The recorded information has been  reviewed and considered.  Charlyne Robertshaw Elayne Guerin, M.D. [facility] [address] [place], [place]  [address] [phone] phone [phone] fax

## 2016-08-18 LAB — HEMOGLOBIN A1C
Hgb A1c MFr Bld: 5.5 % (ref <5.7)
MEAN PLASMA GLUCOSE: 111 mg/dL

## 2016-08-23 ENCOUNTER — Encounter: Payer: Self-pay | Admitting: Gastroenterology

## 2016-09-12 ENCOUNTER — Ambulatory Visit (AMBULATORY_SURGERY_CENTER): Payer: Self-pay | Admitting: *Deleted

## 2016-09-12 VITALS — Ht 72.25 in | Wt 171.8 lb

## 2016-09-12 DIAGNOSIS — Z1211 Encounter for screening for malignant neoplasm of colon: Secondary | ICD-10-CM

## 2016-09-12 MED ORDER — NA SULFATE-K SULFATE-MG SULF 17.5-3.13-1.6 GM/177ML PO SOLN: 1.0000 | Freq: Once | ORAL | 0 refills | Status: AC

## 2016-09-12 NOTE — Progress Notes (Signed)
No egg or soy allergy known to patient  No issues with past sedation with any surgeries  or procedures, no intubation problems  No diet pills per patient No home 02 use per patient  No blood thinners per patient  Pt denies issues with constipation  No A fib or A flutter  emmi video declined   

## 2016-09-23 ENCOUNTER — Encounter: Payer: Self-pay | Admitting: Gastroenterology

## 2016-09-27 ENCOUNTER — Encounter: Payer: No Typology Code available for payment source | Admitting: Gastroenterology

## 2016-10-07 ENCOUNTER — Ambulatory Visit (AMBULATORY_SURGERY_CENTER): Payer: Managed Care, Other (non HMO) | Admitting: Gastroenterology

## 2016-10-07 ENCOUNTER — Encounter: Payer: Self-pay | Admitting: Gastroenterology

## 2016-10-07 VITALS — BP 112/72 | HR 63 | Temp 98.4°F | Resp 9 | Ht 72.5 in | Wt 171.0 lb

## 2016-10-07 DIAGNOSIS — Z1211 Encounter for screening for malignant neoplasm of colon: Secondary | ICD-10-CM | POA: Diagnosis not present

## 2016-10-07 DIAGNOSIS — Z1212 Encounter for screening for malignant neoplasm of rectum: Secondary | ICD-10-CM | POA: Diagnosis not present

## 2016-10-07 MED ORDER — SODIUM CHLORIDE 0.9 % IV SOLN: 500.0000 mL | INTRAVENOUS | Status: DC

## 2016-10-07 NOTE — Op Note (Signed)
[facility] Patient Name: Dustin Fischer Procedure Date: 10/07/2016 10:44 AM MRN: [id number] Endoscopist: Ladene Artist , MD Age: 50 Referring MD:  Date of Birth: [birth year] Gender: Male Account #: [id number] Procedure:                Colonoscopy Indications:              Screening for colorectal malignant neoplasm Medicines:                Monitored Anesthesia Care Procedure:                Pre-Anesthesia Assessment:                           - Prior to the procedure, a History and Physical                            was performed, and patient medications and                            allergies were reviewed. The patient's tolerance of                            previous anesthesia was also reviewed. The risks                            and benefits of the procedure and the sedation                            options and risks were discussed with the patient.                            All questions were answered, and informed consent                            was obtained. Prior Anticoagulants: The patient has                            taken no previous anticoagulant or antiplatelet                            agents. ASA Grade Assessment: II - A patient with                            mild systemic disease. After reviewing the risks                            and benefits, the patient was deemed in                            satisfactory condition to undergo the procedure.                           After obtaining informed consent, the colonoscope  was passed under direct vision. Throughout the                            procedure, the patient's blood pressure, pulse, and                            oxygen saturations were monitored continuously. The                            Model PCF-H190L [id number]) scope was introduced                            through the anus and advanced to the the cecum,                            identified by  appendiceal orifice and ileocecal                            valve. The ileocecal valve, appendiceal orifice,                            and rectum were photographed. The quality of the                            bowel preparation was good. The colonoscopy was                            performed without difficulty. The patient tolerated                            the procedure well. Scope In: 10:54:27 AM Scope Out: 11:03:37 AM Scope Withdrawal Time: 0 hours 8 minutes 8 seconds  Total Procedure Duration: 0 hours 9 minutes 10 seconds  Findings:                 The perianal and digital rectal examinations were                            normal.                           Internal hemorrhoids were found during                            retroflexion. The hemorrhoids were medium-sized and                            Grade I (internal hemorrhoids that do not prolapse).                           The exam was otherwise without abnormality on                            direct and retroflexion views. Complications:            No immediate complications.  Estimated blood loss:                            None. Estimated Blood Loss:     Estimated blood loss: none. Impression:               - Internal hemorrhoids.                           - The examination was otherwise normal on direct                            and retroflexion views.                           - No specimens collected. Recommendation:           - Repeat colonoscopy in 10 years for screening                            purposes.                           - Patient has a contact number available for                            emergencies. The signs and symptoms of potential                            delayed complications were discussed with the                            patient. Return to normal activities tomorrow.                            Written discharge instructions were provided to the                            patient.                            - Resume previous diet.                           - Continue present medications. Ladene Artist, MD 10/07/2016 11:08:34 AM This report has been signed electronically.

## 2016-10-07 NOTE — Patient Instructions (Signed)
YOU HAD AN ENDOSCOPIC PROCEDURE TODAY AT [facility]:   Refer to the procedure report that was given to you for any specific questions about what was found during the examination.  If the procedure report does not answer your questions, please call your gastroenterologist to clarify.  If you requested that your care partner not be given the details of your procedure findings, then the procedure report has been included in a sealed envelope for you to review at your convenience later.  YOU SHOULD EXPECT: Some feelings of bloating in the abdomen. Passage of more gas than usual.  Walking can help get rid of the air that was put into your GI tract during the procedure and reduce the bloating.   Please Note:  You might notice some irritation and congestion in your nose or some drainage.  This is from the oxygen used during your procedure.  There is no need for concern and it should clear up in a day or so.  SYMPTOMS TO REPORT IMMEDIATELY:   Following lower endoscopy (colonoscopy or flexible sigmoidoscopy):  Excessive amounts of blood in the stool  Significant tenderness or worsening of abdominal pains  Swelling of the abdomen that is new, acute  Fever of 100F or higher  For urgent or emergent issues, a gastroenterologist can be reached at any hour by calling [phone].   DIET:  We do recommend a small meal at first, but then you may proceed to your regular diet.  Drink plenty of fluids but you should avoid alcoholic beverages for 24 hours.  ACTIVITY:  You should plan to take it easy for the rest of today and you should NOT DRIVE or use heavy machinery until tomorrow (because of the sedation medicines used during the test).    FOLLOW UP: Our staff will call the number listed on your records the next business day following your procedure to check on you and address any questions or concerns that you may have regarding the information given to you following your procedure. If we  do not reach you, we will leave a message.  However, if you are feeling well and you are not experiencing any problems, there is no need to return our call.  We will assume that you have returned to your regular daily activities without incident.  SIGNATURES/CONFIDENTIALITY: You and/or your care partner have signed paperwork which will be entered into your electronic medical record.  These signatures attest to the fact that that the information above on your After Visit Summary has been reviewed and is understood.  Full responsibility of the confidentiality of this discharge information lies with you and/or your care-partner.  Next colonoscopy- 10 years  Please continue your normal medications  Please read over handouts about hemorrhoids and high fiber diets

## 2016-10-07 NOTE — Progress Notes (Signed)
A and O x3. Report to RN. Tolerated MAC anesthesia well. 

## 2016-10-08 ENCOUNTER — Telehealth: Payer: Self-pay

## 2016-10-08 NOTE — Telephone Encounter (Signed)
  Follow up Call-  Call back number 10/07/2016  Post procedure Call Back phone  # [phone]  Permission to leave phone message Yes  Some recent data might be hidden     Patient questions:  Do you have a fever, pain , or abdominal swelling? No. Pain Score  0 *  Have you tolerated food without any problems? Yes.    Have you been able to return to your normal activities? Yes.    Do you have any questions about your discharge instructions: Diet   No. Medications  No. Follow up visit  No.  Do you have questions or concerns about your Care? No.  Actions: * If pain score is 4 or above: No action needed, pain <4.

## 2016-10-08 NOTE — Telephone Encounter (Signed)
  Follow up Call-  Call back number 10/07/2016  Post procedure Call Back phone  # [phone]  Permission to leave phone message Yes  Some recent data might be hidden    Patient was called for follow up after his procedure on 10/07/2016. No answer at the number given for follow up phone call. A message was left on the answering machine.

## 2018-04-15 ENCOUNTER — Other Ambulatory Visit: Payer: Self-pay

## 2018-04-15 ENCOUNTER — Ambulatory Visit: Payer: Managed Care, Other (non HMO) | Admitting: Physician Assistant

## 2018-04-15 ENCOUNTER — Telehealth: Payer: Self-pay | Admitting: Physician Assistant

## 2018-04-15 ENCOUNTER — Encounter: Payer: Self-pay | Admitting: Physician Assistant

## 2018-04-15 VITALS — BP 110/80 | HR 79 | Temp 98.2°F | Resp 18 | Ht 72.5 in | Wt 166.0 lb

## 2018-04-15 DIAGNOSIS — M62838 Other muscle spasm: Secondary | ICD-10-CM | POA: Diagnosis not present

## 2018-04-15 MED ORDER — CYCLOBENZAPRINE HCL 10 MG PO TABS: 5.0000 mg | ORAL_TABLET | Freq: Three times a day (TID) | ORAL | 0 refills | Status: DC | PRN

## 2018-04-15 NOTE — Progress Notes (Signed)
Subjective:    Patient ID: Dustin Fischer, male    DOB: [birth year], 52 y.o.   MRN: [id number] Chief Complaint  Patient presents with  . Shoulder Pain    x2 1/2 weeks, right back shoulder, Pt states he has full range of motion of shoulder.  . Neck Pain    base of the neck going to into the right shoulder, pt states it feels like pinched nerve    HPI  53 yo left handed male presents for RIGHT sided shoulder and neck pain. He states pain is near his right scapula and has occurred for the past 2 1/2 weeks.  Does not hurt while up and moving around, worse with driving his wifes car and sleep.   Normal ROM of his shoulder. Increased pain on R side base of neck while looking up or right. He reports carrying a 50 lb backpack regularly while traveling, unsure if this is linked or not, recently traveled prior to pain onset. He said it feels like a pinched nerve.   Has decreased pain when his wife massages it Went for a massage last week, no relief. He has tried heating pad, hot showers which have helped relax the muscle. Has also tried an expired  muscle relaxer (robaxin) that he found in his shaving kit, no significant relief.  Denies numbness, tingling, decreased grip strength  Review of Systems  Constitutional: Negative.   HENT: Negative.   Eyes: Negative.   Respiratory: Negative.   Cardiovascular: Negative.   Gastrointestinal: Negative.   Endocrine: Negative.   Genitourinary: Negative.   Musculoskeletal: Positive for myalgias and neck pain. Negative for arthralgias, gait problem, joint swelling and neck stiffness.  Skin: Negative.   Allergic/Immunologic: Negative.   Neurological: Negative.   Hematological: Negative.   Psychiatric/Behavioral: Negative.      Patient Active Problem List   Diagnosis Date Noted  . Gastroesophageal reflux disease without esophagitis 08/17/2016  . Bilateral low back pain without sciatica 08/17/2016  . Hearing loss 08/17/2016  . Basal cell carcinoma  08/17/2016    Past Medical History:  Diagnosis Date  . Arthritis    back   . Basal cell carcinoma, face   . GERD (gastroesophageal reflux disease)   . Ulcer of abdomen wall ([place])    childhood PUD    Prior to Admission medications   Medication Sig Start Date End Date Taking? Authorizing Provider  omeprazole (PRILOSEC OTC) 20 MG tablet Take 20 mg by mouth daily.   Yes [provider]  methocarbamol (ROBAXIN) 500 MG tablet Take 1 tablet (500 mg total) by mouth at bedtime. Patient not taking: Reported on 10/07/2016 08/17/16   Wardell Honour, MD  OVER THE COUNTER MEDICATION Liquid OTC joint supplement daily ? Name    [provider]    Allergies  Allergen Reactions  . Aspirin     Internal bleeding as a child  . Ibuprofen        Objective:   Physical Exam  Constitutional: He is oriented to person, place, and time. He appears well-developed and well-nourished. No distress.  BP 110/80 (BP Location: Left Arm, Patient Position: Sitting, Cuff Size: Normal)   Pulse 79   Temp 98.2 F (36.8 C) (Oral)   Resp 18   Ht 6' 0.5" (1.842 m)   Wt 166 lb (75.3 kg)   SpO2 98%   BMI 22.20 kg/m    HENT:  Head: Normocephalic and atraumatic.  Eyes: Conjunctivae and EOM are normal.  Neck: Normal  range of motion and full passive range of motion without pain. Muscular tenderness present. No spinous process tenderness present. No neck rigidity. No edema present.  Cardiovascular: Normal rate, regular rhythm, normal heart sounds and intact distal pulses. Exam reveals no gallop and no friction rub.  No murmur heard. Pulmonary/Chest: Effort normal and breath sounds normal.  Musculoskeletal: Normal range of motion. He exhibits tenderness (Trapezius). He exhibits no edema or deformity.       Right shoulder: He exhibits normal range of motion, no tenderness, no bony tenderness, no swelling, no effusion, no deformity, no pain, no spasm and normal strength.       Cervical back: He  exhibits tenderness and pain. He exhibits normal range of motion, no bony tenderness, no swelling, no edema and no deformity.  Trapezius muscle feels tense. Noticeable spasm with trigger points evident. Therapeutic with palpation.   Neurological: He is alert and oriented to person, place, and time. He displays normal reflexes. No cranial nerve deficit. He exhibits normal muscle tone. Coordination normal.  Reflex Scores:      Brachioradialis reflexes are 2+ on the right side and 2+ on the left side. Skin: Skin is warm and dry. No rash noted. He is not diaphoretic. No erythema. No pallor.  Psychiatric: He has a normal mood and affect. His behavior is normal.       Assessment & Plan:  1. Trapezius muscle spasm Likely muscle spasm. Normal neuro exam. Informed to try cyclobenzaprine. Drink plenty of fluids. Massage therapy to assist with muscle relaxation and releasing trigger points.  - cyclobenzaprine (FLEXERIL) 10 MG tablet; Take 0.5-1 tablets (5-10 mg total) by mouth 3 (three) times daily as needed for muscle spasms.  Dispense: 30 tablet; Refill: 0

## 2018-04-15 NOTE — Progress Notes (Signed)
Patient ID: Dustin Fischer, male    DOB: [birth year], 52 y.o.   MRN: [id number]  PCP: Patient, No Pcp Per  Chief Complaint  Patient presents with  . Shoulder Pain    x2 1/2 weeks, right back shoulder, Pt states he has full range of motion of shoulder.  . Neck Pain    base of the neck going to into the right shoulder, pt states it feels like pinched nerve    Subjective:   Presents for evaluation of right shoulder and neck pain for the past 2 and half weeks.  No specific trauma or injury that he can recall though he does carry a 50 pound backpack regularly when traveling.  He is left-hand dominant.  He is able to move his shoulder in full range of motion without pain. Increased pain when he is driving his wife's car and while lying down to sleep.  Decreased pain with massage. Application of heat helps the pain and he can feel relaxation of the muscle in the area. Found some leftover methocarbamol and took it without relief.  He describes pain feeling "like a pinched nerve." No numbness or tingling, radicular pain or decreased grip strength.    Review of Systems Constitutional: Negative.   HENT: Negative.   Eyes: Negative.   Respiratory: Negative.   Cardiovascular: Negative.   Gastrointestinal: Negative.   Endocrine: Negative.   Genitourinary: Negative.   Musculoskeletal: Positive for myalgias and neck pain. Negative for arthralgias, gait problem, joint swelling and neck stiffness.  Skin: Negative.   Allergic/Immunologic: Negative.   Neurological: Negative.   Hematological: Negative.   Psychiatric/Behavioral: Negative.        Patient Active Problem List   Diagnosis Date Noted  . Gastroesophageal reflux disease without esophagitis 08/17/2016  . Bilateral low back pain without sciatica 08/17/2016  . Hearing loss 08/17/2016  . Basal cell carcinoma 08/17/2016     Prior to Admission medications   Medication Sig Start Date End Date Taking? Authorizing Provider    omeprazole (PRILOSEC OTC) 20 MG tablet Take 20 mg by mouth daily.   Yes [provider]  methocarbamol (ROBAXIN) 500 MG tablet Take 1 tablet (500 mg total) by mouth at bedtime. Patient not taking: Reported on 10/07/2016 08/17/16   Wardell Honour, MD  OVER THE COUNTER MEDICATION Liquid OTC joint supplement daily ? Name    [provider]     Allergies  Allergen Reactions  . Aspirin     Internal bleeding as a child  . Ibuprofen        Objective:  Physical Exam  Constitutional: He is oriented to person, place, and time. He appears well-developed and well-nourished. He is active and cooperative. No distress.  BP 110/80 (BP Location: Left Arm, Patient Position: Sitting, Cuff Size: Normal)   Pulse 79   Temp 98.2 F (36.8 C) (Oral)   Resp 18   Ht 6' 0.5" (1.842 m)   Wt 166 lb (75.3 kg)   SpO2 98%   BMI 22.20 kg/m   HENT:  Head: Normocephalic and atraumatic.  Right Ear: Hearing normal.  Left Ear: Hearing normal.  Eyes: Conjunctivae are normal. No scleral icterus.  Neck: Normal range of motion. Neck supple. No thyromegaly present.  Cardiovascular: Normal rate, regular rhythm and normal heart sounds.  Pulses:      Radial pulses are 2+ on the right side, and 2+ on the left side.  Pulmonary/Chest: Effort normal and breath sounds normal.  Musculoskeletal:  Right shoulder: He exhibits tenderness (trapezius), pain and spasm (trapezius). He exhibits normal range of motion, no bony tenderness, no swelling, no effusion, no crepitus, no deformity, no laceration, normal pulse and normal strength.       Left shoulder: Normal.       Cervical back: He exhibits tenderness (RIGHT sided cervical paraspinous muscles), pain and spasm. He exhibits normal range of motion, no bony tenderness, no swelling, no edema, no deformity, no laceration and normal pulse.  Lymphadenopathy:       Head (right side): No tonsillar, no preauricular, no posterior auricular and no occipital  adenopathy present.       Head (left side): No tonsillar, no preauricular, no posterior auricular and no occipital adenopathy present.    He has no cervical adenopathy.       Right: No supraclavicular adenopathy present.       Left: No supraclavicular adenopathy present.  Neurological: He is alert and oriented to person, place, and time. He has normal strength. No sensory deficit.  Skin: Skin is warm, dry and intact. No rash noted. No cyanosis or erythema. Nails show no clubbing.  Psychiatric: He has a normal mood and affect. His speech is normal and behavior is normal.           Assessment & Plan:   1. Trapezius muscle spasm Avoid NSAIDs due to gastroesophageal reflux disease.  Try cyclobenzaprine.  Continue application of heat.  Continue massage.  Would consider use of topical diclofenac gel if symptoms persist. - cyclobenzaprine (FLEXERIL) 10 MG tablet; Take 0.5-1 tablets (5-10 mg total) by mouth 3 (three) times daily as needed for muscle spasms.  Dispense: 30 tablet; Refill: 0    Return if symptoms worsen or fail to improve.   Fara Chute, PA-C [facility]

## 2018-04-15 NOTE — Patient Instructions (Addendum)
You can take acetaminophen for pain. Try the cyclobenzaprine, possibly reserving it for evenings, as it can be sedating. Use a warm compress to the area often. Consider more massage!    IF you received an x-ray today, you will receive an invoice from [facility]. Please contact [facility] at [phone] with questions or concerns regarding your invoice.   IF you received labwork today, you will receive an invoice from [facility]. Please contact [facility] at [phone] with questions or concerns regarding your invoice.   Our billing staff will not be able to assist you with questions regarding bills from these companies.  You will be contacted with the lab results as soon as they are available. The fastest way to get your results is to activate your My Chart account. Instructions are located on the last page of this paperwork. If you have not heard from [place] regarding the results in 2 weeks, please contact this office.

## 2018-04-15 NOTE — Telephone Encounter (Signed)
Copied from [place]. Topic: Quick Communication - See Telephone Encounter >> Apr 15, 2018  5:26 PM Ivar Drape wrote: CRM for notification. See Telephone encounter for: 04/15/18. Patient wants to know if he can have an order or prescription for a shoulder theraputic message.  He wants to know so insurance can pay for it or he can pay for it with his benny card.

## 2018-04-16 NOTE — Telephone Encounter (Signed)
PEC message re: massage rx sent to [facility]

## 2018-04-18 MED ORDER — NON FORMULARY: 99 refills | Status: DC

## 2018-04-18 NOTE — Telephone Encounter (Signed)
Yes.  Meds ordered this encounter  Medications  . NON FORMULARY    Sig: Therapeutic RIGHT shoulder, trapezius and cervical neck massage    Dispense:  1 Units    Refill:  prn    Order Specific Question:   Supervising Provider    Answer:   Brigitte Pulse, EVA N [[id number]]

## 2018-04-20 NOTE — Telephone Encounter (Signed)
Phone call to patient. Informed prescription for massage has been printed by provider, needs provider signature. Will call once ready for pickup. Patient appreciative of call.

## 2018-04-21 NOTE — Telephone Encounter (Signed)
Left detailed VM advising pt that rx was ready for pickup at 104

## 2018-04-21 NOTE — Telephone Encounter (Signed)
Signed paper copy and returned to [employer]

## 2018-05-18 ENCOUNTER — Encounter: Payer: Self-pay | Admitting: Family Medicine

## 2018-08-03 ENCOUNTER — Other Ambulatory Visit: Payer: Self-pay

## 2018-08-03 ENCOUNTER — Encounter: Payer: Self-pay | Admitting: Family Medicine

## 2018-08-03 ENCOUNTER — Ambulatory Visit: Payer: Managed Care, Other (non HMO) | Admitting: Family Medicine

## 2018-08-03 VITALS — BP 117/76 | HR 64 | Temp 98.0°F | Ht 73.0 in | Wt 171.6 lb

## 2018-08-03 DIAGNOSIS — Z131 Encounter for screening for diabetes mellitus: Secondary | ICD-10-CM | POA: Diagnosis not present

## 2018-08-03 DIAGNOSIS — Z8711 Personal history of peptic ulcer disease: Secondary | ICD-10-CM

## 2018-08-03 DIAGNOSIS — Z136 Encounter for screening for cardiovascular disorders: Secondary | ICD-10-CM | POA: Diagnosis not present

## 2018-08-03 DIAGNOSIS — Z01818 Encounter for other preprocedural examination: Secondary | ICD-10-CM

## 2018-08-03 DIAGNOSIS — Z130 Encounter for screening for diseases of the blood and blood-forming organs and certain disorders involving the immune mechanism: Secondary | ICD-10-CM | POA: Diagnosis not present

## 2018-08-03 NOTE — Patient Instructions (Addendum)
  I do not identify any concerns on your exam today, but will check lab work for the preoperative clearance.  Once I have those results, I can complete your paperwork.  Thank you for coming in today.  Schedule physical with me in the next few months and we can review other health maintenance at that time.   IF you received an x-ray today, you will receive an invoice from [facility]. Please contact [facility] at [phone] with questions or concerns regarding your invoice.   IF you received labwork today, you will receive an invoice from [facility]. Please contact [facility] at [phone] with questions or concerns regarding your invoice.   Our billing staff will not be able to assist you with questions regarding bills from these companies.  You will be contacted with the lab results as soon as they are available. The fastest way to get your results is to activate your My Chart account. Instructions are located on the last page of this paperwork. If you have not heard from [place] regarding the results in 2 weeks, please contact this office.

## 2018-08-03 NOTE — Progress Notes (Signed)
Subjective:  By signing my name below, I, Dustin Fischer, attest that this documentation has been prepared under the direction and in the presence of [name], MD Electronically Signed: Ladene Artist, ED Scribe 08/03/2018 at 12:03 PM.   Patient ID: Dustin Fischer, male    DOB: Jul 08, 1966, 52 y.o.   MRN: [id number]  Chief Complaint  Patient presents with  . surgical clearance    neck disc replacement.    HPI Dustin Fischer is a 52 y.o. male who presents to Primary Care at [facility] for surgical clearance. H/o  On omeprazole. Currently followed by Dr. Rolena Infante with [facility] for neck pain, last visit was this morning. 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 Planning for cervical disc surgery. - Pt had a MRI done 2 days ago. He last had surgery when he was 52 y.o without reactions to anesthesia. H/o stomach ulcer since middle school with he treats with omeprazole qd. No anticoagulation use, pulmonary or cardiac h/o. Pt used to run 3-4 miles 4-5 days/wk but quit 6 wks ago due to neck pain. Denies cp, sob on exertion or with walking up stairs. No h/o tobacco use, DVT/PE, COPD, sleep apnea, chronic snoring or daytime somnolence. RCRI/Goldman index score of 0.  Patient Active Problem List   Diagnosis Date Noted  . Gastroesophageal reflux disease without esophagitis 08/17/2016  . Bilateral low back pain without sciatica 08/17/2016  . Hearing loss 08/17/2016  . Basal cell carcinoma 08/17/2016   Past Medical History:  Diagnosis Date  . Arthritis    back   . Basal cell carcinoma, face   . GERD (gastroesophageal reflux disease)   . Ulcer of abdomen wall ([place])    childhood PUD   Past Surgical History:  Procedure Laterality Date  . ESOPHAGEAL DILATION    . HERNIA REPAIR    . UPPER GASTROINTESTINAL ENDOSCOPY    . WISDOM TOOTH EXTRACTION     Allergies  Allergen Reactions  . Aspirin     Internal bleeding as a child  . Ibuprofen    Prior to Admission medications   Medication Sig Start Date End  Date Taking? Authorizing Provider  cyclobenzaprine (FLEXERIL) 10 MG tablet Take 0.5-1 tablets (5-10 mg total) by mouth 3 (three) times daily as needed for muscle spasms. 04/15/18  Yes Jeffery, Chelle, PA-C  omeprazole (PRILOSEC OTC) 20 MG tablet Take 20 mg by mouth daily.   Yes [provider]  OVER THE COUNTER MEDICATION Liquid OTC joint supplement daily ? Name   Yes [provider]  NON FORMULARY Therapeutic RIGHT shoulder, trapezius and cervical neck massage Patient not taking: Reported on 08/03/2018 04/18/18   Harrison Mons, PA-C   Social History   Socioeconomic History  . Marital status: Married    Spouse name: Not on file  . Number of children: Not on file  . Years of education: Not on file  . Highest education level: Not on file  Occupational History  . Not on file  Social Needs  . Financial resource strain: Not on file  . Food insecurity:    Worry: Not on file    Inability: Not on file  . Transportation needs:    Medical: Not on file    Non-medical: Not on file  Tobacco Use  . Smoking status: Never Smoker  . Smokeless tobacco: Never Used  Substance and Sexual Activity  . Alcohol use: No  . Drug use: No  . Sexual activity: Never  Lifestyle  . Physical  activity:    Days per week: Not on file    Minutes per session: Not on file  . Stress: Not on file  Relationships  . Social connections:    Talks on phone: Not on file    Gets together: Not on file    Attends religious service: Not on file    Active member of club or organization: Not on file    Attends meetings of clubs or organizations: Not on file    Relationship status: Not on file  . Intimate partner violence:    Fear of current or ex partner: Not on file    Emotionally abused: Not on file    Physically abused: Not on file    Forced sexual activity: Not on file  Other Topics Concern  . 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 Mostly [place] travel      Tobacco: none       Alcohol: none       Exercises: jogging 3-4 miles 3-4 times per week      Seatbelt: 100%; some texting while driving                Review of Systems  Respiratory: Negative for shortness of breath.   Cardiovascular: Negative for chest pain.      Objective:   Physical Exam  Constitutional: He is oriented to person, place, and time. He appears well-developed and well-nourished.  HENT:  Head: Normocephalic and atraumatic.  Eyes: Pupils are equal, round, and reactive to light. EOM are normal.  Neck: No JVD present. Carotid bruit is not present.  Cardiovascular: Normal rate, regular rhythm and normal heart sounds.  No murmur heard. No third heart sound.  Pulmonary/Chest: Effort normal and breath sounds normal. He has no rales.  Musculoskeletal: He exhibits no edema.  Neurological: He is alert and oriented to person, place, and time.  Skin: Skin is warm and dry.  Psychiatric: He has a normal mood and affect.  Vitals reviewed.  Vitals:   08/03/18 1133  BP: 117/76  Pulse: 64  Temp: 98 F (36.7 C)  TempSrc: Oral  SpO2: 98%  Weight: 171 lb 9.6 oz (77.8 kg)  Height: 6\' 1"  (1.854 m)   EKG: Sinus rhythm. Rate 61  Flat T wave in V6, no acute findings.     Assessment & Plan:  Dustin Fischer is a 52 y.o. male Preoperative evaluation to rule out surgical contraindication - Plan: EKG 12-Lead  History of peptic ulcer disease - Plan: CBC  Screening, anemia, deficiency, iron  Screening for diabetes mellitus - Plan: Comprehensive metabolic panel  Screening for cardiovascular condition - Plan: EKG 12-Lead  Preoperative clearance.  Does not appear to be at increased risk of major adverse cardiac event based on reported metabolic equivalents and RCRI index.  Will screen for anemia, diabetes, other electrolyte tests with CBC, CMP.   EKG reassuring.  Recommended to continue his proton pump inhibitor with history of peptic ulcer disease.  Plan on paperwork completion once labs have returned.  Establish with me as primary care provider, plan on physical next few months.   No orders of the defined types were placed in this encounter.  Patient Instructions    I do not identify any  concerns on your exam today, but will check lab work for the preoperative clearance.  Once I have those results, I can complete your paperwork.  Thank you for coming in today.  Schedule physical with me in the next few months and we can review other health maintenance at that time.   IF you received an x-ray today, you will receive an invoice from [facility]. Please contact [facility] at [phone] with questions or concerns regarding your invoice.   IF you received labwork today, you will receive an invoice from [place]. Please contact [facility] at [phone] with questions or concerns regarding your invoice.   Our billing staff will not be able to assist you with questions regarding bills from these companies.  You will be contacted with the lab results as soon as they are available. The fastest way to get your results is to activate your My Chart account. Instructions are located on the last page of this paperwork. If you have not heard from [place] regarding the results in 2 weeks, please contact this office.       I personally performed the services described in this documentation, which was scribed in my presence. The recorded information has been reviewed and considered for accuracy and completeness, addended by me as needed, and agree with information above.  Signed,   Merri Ray, MD [facility].  08/03/18 12:52 PM

## 2018-08-04 LAB — CBC
Hematocrit: 43.8 % (ref 37.5–51.0)
Hemoglobin: 13.8 g/dL (ref 13.0–17.7)
MCH: 29.9 pg (ref 26.6–33.0)
MCHC: 31.5 g/dL (ref 31.5–35.7)
MCV: 95 fL (ref 79–97)
PLATELETS: 269 10*3/uL (ref 150–450)
RBC: 4.62 x10E6/uL (ref 4.14–5.80)
RDW: 13.2 % (ref 12.3–15.4)
WBC: 6.4 10*3/uL (ref 3.4–10.8)

## 2018-08-04 LAB — COMPREHENSIVE METABOLIC PANEL
A/G RATIO: 2 (ref 1.2–2.2)
ALT: 17 IU/L (ref 0–44)
AST: 18 IU/L (ref 0–40)
Albumin: 4.8 g/dL (ref 3.5–5.5)
Alkaline Phosphatase: 53 IU/L (ref 39–117)
BUN/Creatinine Ratio: 14 (ref 9–20)
BUN: 12 mg/dL (ref 6–24)
Bilirubin Total: 0.3 mg/dL (ref 0.0–1.2)
CO2: 27 mmol/L (ref 20–29)
Calcium: 9.9 mg/dL (ref 8.7–10.2)
Chloride: 101 mmol/L (ref 96–106)
Creatinine, Ser: 0.88 mg/dL (ref 0.76–1.27)
GFR calc Af Amer: 113 mL/min/{1.73_m2} (ref >59)
GFR calc non Af Amer: 98 mL/min/{1.73_m2} (ref >59)
Globulin, Total: 2.4 g/dL (ref 1.5–4.5)
Glucose: 91 mg/dL (ref 65–99)
POTASSIUM: 4.6 mmol/L (ref 3.5–5.2)
Sodium: 141 mmol/L (ref 134–144)
Total Protein: 7.2 g/dL (ref 6.0–8.5)

## 2018-08-13 ENCOUNTER — Telehealth: Payer: Self-pay | Admitting: Family Medicine

## 2018-08-13 NOTE — Telephone Encounter (Signed)
preop clearance form completed and placed in fax box.

## 2018-08-18 NOTE — Progress Notes (Addendum)
PCP: Merri Ray, MD  Cardiologist:ot denies  EKG: 08/03/18 in EPIC  Stress test: pt denies ever  ECHO: pt denies ever  Cardiac Cath: pt denies ever  Chest x-ray: pt denies past year, no recent respiratory infections/complications

## 2018-08-18 NOTE — Pre-Procedure Instructions (Signed)
Dustin Fischer  08/18/2018      [facility], [place] - [address] [place] [address] Phone: [phone] Fax: [phone]    Your procedure is scheduled on August 27, 2018.  Report to [facility] Admitting at 1230 PM.  Call this number if you have problems the morning of surgery:  [phone]   Remember:  Do not eat or drink after midnight.    Take these medicines the morning of surgery with A SIP OF WATER  Tylenol-if needed Omeprazole (prilosec)  7 days prior to surgery STOP taking any Aspirin (unless otherwise instructed by your surgeon), Aleve, Naproxen, Ibuprofen, Motrin, Advil, Goody's, BC's, all herbal medications, fish oil, and all vitamins   Do not wear jewelry  Do not wear lotions, powders, or colognes, or deodorant.  Men may shave face and neck.  Do not bring valuables to the hospital.  [facility] is not responsible for any belongings or valuables.  Contacts, dentures or bridgework may not be worn into surgery.  Leave your suitcase in the car.  After surgery it may be brought to your room.  For patients admitted to the hospital, discharge time will be determined by your treatment team.  Patients discharged the day of surgery will not be allowed to drive home.    [facility]- Preparing For Surgery  Before surgery, you can play an important role. Because skin is not sterile, your skin needs to be as free of germs as possible. You can reduce the number of germs on your skin by washing with CHG (chlorahexidine gluconate) Soap before surgery.  CHG is an antiseptic cleaner which kills germs and bonds with the skin to continue killing germs even after washing.    Oral Hygiene is also important to reduce your risk of infection.  Remember - BRUSH YOUR TEETH THE MORNING OF SURGERY WITH YOUR REGULAR TOOTHPASTE  Please do not use if you have an allergy to CHG or antibacterial soaps. If your skin becomes  reddened/irritated stop using the CHG.  Do not shave (including legs and underarms) for at least 48 hours prior to first CHG shower. It is OK to shave your face.  Please follow these instructions carefully.   1. Shower the NIGHT BEFORE SURGERY and the MORNING OF SURGERY with CHG.   2. If you chose to wash your hair, wash your hair first as usual with your normal shampoo.  3. After you shampoo, rinse your hair and body thoroughly to remove the shampoo.  4. Use CHG as you would any other liquid soap. You can apply CHG directly to the skin and wash gently with a scrungie or a clean washcloth.   5. Apply the CHG Soap to your body ONLY FROM THE NECK DOWN.  Do not use on open wounds or open sores. Avoid contact with your eyes, ears, mouth and genitals (private parts). Wash Face and genitals (private parts)  with your normal soap.  6. Wash thoroughly, paying special attention to the area where your surgery will be performed.  7. Thoroughly rinse your body with warm water from the neck down.  8. DO NOT shower/wash with your normal soap after using and rinsing off the CHG Soap.  9. Pat yourself dry with a CLEAN TOWEL.  10. Wear CLEAN PAJAMAS to bed the night before surgery, wear comfortable clothes the morning of surgery  11. Place CLEAN SHEETS on your bed the night of your first  shower and DO NOT SLEEP WITH PETS.  Day of Surgery:  Do not apply any deodorants/lotions.  Please wear clean clothes to the hospital/surgery center.   Remember to brush your teeth WITH YOUR REGULAR TOOTHPASTE.  Please read over the following fact sheets that you were given.

## 2018-08-19 ENCOUNTER — Other Ambulatory Visit: Payer: Self-pay

## 2018-08-19 ENCOUNTER — Encounter (HOSPITAL_COMMUNITY): Payer: Self-pay

## 2018-08-19 ENCOUNTER — Encounter (HOSPITAL_COMMUNITY)
Admission: RE | Admit: 2018-08-19 | Discharge: 2018-08-19 | Disposition: A | Discharge status: Home / Self Care | Payer: Managed Care, Other (non HMO) | Source: Ambulatory Visit | Attending: Orthopedic Surgery | Admitting: Orthopedic Surgery

## 2018-08-19 DIAGNOSIS — Z01818 Encounter for other preprocedural examination: Secondary | ICD-10-CM | POA: Insufficient documentation

## 2018-08-19 DIAGNOSIS — M50121 Cervical disc disorder at C4-C5 level with radiculopathy: Secondary | ICD-10-CM | POA: Insufficient documentation

## 2018-08-19 LAB — BASIC METABOLIC PANEL
Anion gap: 7 (ref 5–15)
BUN: 12 mg/dL (ref 6–20)
CHLORIDE: 106 mmol/L (ref 98–111)
CO2: 28 mmol/L (ref 22–32)
CREATININE: 1.1 mg/dL (ref 0.61–1.24)
Calcium: 9.4 mg/dL (ref 8.9–10.3)
GFR calc Af Amer: >60 mL/min (ref >60)
GFR calc non Af Amer: >60 mL/min (ref >60)
Glucose, Bld: 72 mg/dL (ref 70–99)
Potassium: 3.8 mmol/L (ref 3.5–5.1)
SODIUM: 141 mmol/L (ref 135–145)

## 2018-08-19 LAB — CBC
HEMATOCRIT: 43.8 % (ref 39.0–52.0)
Hemoglobin: 14.3 g/dL (ref 13.0–17.0)
MCH: 31.9 pg (ref 26.0–34.0)
MCHC: 32.6 g/dL (ref 30.0–36.0)
MCV: 97.8 fL (ref 78.0–100.0)
Platelets: 246 10*3/uL (ref 150–400)
RBC: 4.48 MIL/uL (ref 4.22–5.81)
RDW: 11.9 % (ref 11.5–15.5)
WBC: 5.8 10*3/uL (ref 4.0–10.5)

## 2018-08-19 LAB — SURGICAL PCR SCREEN
MRSA, PCR: NEGATIVE
STAPHYLOCOCCUS AUREUS: NEGATIVE

## 2018-08-20 NOTE — Progress Notes (Signed)
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 Pertinent hx includes GERD.  Anesthesia review requested by Dr. Rolena Infante. Pt has medical clearance from PCP Dr. Merri Ray.  Anticipate he can proceed with surgery as planned barring acute status change.  VS: BP 136/82   Pulse 73   Temp 36.6 C (Oral)   Resp 16   Ht 6\' 1"  (1.854 m)   Wt 77.1 kg   SpO2 100%   BMI 22.43 kg/m   PROVIDERS: [name], MD is PCP last seen 08/03/2018  LABS: Labs reviewed: Acceptable for surgery. (all labs ordered are listed, but only abnormal results are displayed)  Labs Reviewed  SURGICAL PCR SCREEN  BASIC METABOLIC PANEL  CBC     IMAGES: N/A   EKG: 08/03/18: Sinus rhythm with nonspecific t abnormality  CV: N/A  Past Medical History:  Diagnosis Date  . Arthritis    back   . Basal cell carcinoma, face   . GERD (gastroesophageal reflux disease)   . Ulcer of abdomen wall ([place])    childhood PUD    Past Surgical History:  Procedure Laterality Date  . ESOPHAGEAL DILATION    . HERNIA REPAIR    . UPPER GASTROINTESTINAL ENDOSCOPY    . WISDOM TOOTH EXTRACTION      MEDICATIONS: . acetaminophen (TYLENOL) 500 MG tablet  . cyclobenzaprine (FLEXERIL) 10 MG tablet  . NON FORMULARY  . omeprazole (PRILOSEC OTC) 20 MG tablet   . 0.9 %  sodium chloride infusion    Wynonia Musty [facility] Short Stay Center/Anesthesiology Phone [phone] 08/20/2018 10:59 AM

## 2018-08-21 NOTE — H&P (Addendum)
Patient ID: DYKE WEIBLE MRN: [id number] DOB/AGE: 21-Feb-1966 52 y.o.  Admit date: (Not on file)  Admission Diagnoses:  Cervical Disc Herbiation  HPI: pt presents to the clinic for his H & P prior his Total Disc Replacement.  Pt has a hx of duodenal ulcer. Still on medication for such.  Past Medical History: Past Medical History:  Diagnosis Date  . Arthritis    back   . Basal cell carcinoma, face   . GERD (gastroesophageal reflux disease)   . Ulcer of abdomen wall ([place])    childhood PUD    Surgical History: Past Surgical History:  Procedure Laterality Date  . ESOPHAGEAL DILATION    . HERNIA REPAIR    . UPPER GASTROINTESTINAL ENDOSCOPY    . WISDOM TOOTH EXTRACTION      Family History: Family History  Problem Relation Age of Onset  . Heart failure Mother   . Hyperlipidemia Mother   . Heart disease Mother 34       pacemaker/defibrillator  . Colon polyps Mother   . Hyperlipidemia Father   . Colon polyps Father   . Colon cancer Neg Hx   . Rectal cancer Neg Hx   . Stomach cancer Neg Hx   . Esophageal cancer Neg Hx     Social History: Social History   Socioeconomic History  . Marital status: Married    Spouse name: Not on file  . Number of children: Not on file  . Years of education: Not on file  . Highest education level: Not on file  Occupational History  . Not on file  Social Needs  . Financial resource strain: Not on file  . Food insecurity:    Worry: Not on file    Inability: Not on file  . Transportation needs:    Medical: Not on file    Non-medical: Not on file  Tobacco Use  . Smoking status: Never Smoker  . Smokeless tobacco: Never Used  Substance and Sexual Activity  . Alcohol use: No  . Drug use: No  . Sexual activity: Never  Lifestyle  . Physical activity:    Days per week: Not on file    Minutes per session: Not on file  . Stress: Not on file  Relationships  . Social connections:    Talks on phone: Not on file    Gets  together: Not on file    Attends religious service: Not on file    Active member of club or organization: Not on file    Attends meetings of clubs or organizations: Not on file    Relationship status: Not on file  . Intimate partner violence:    Fear of current or ex partner: Not on file    Emotionally abused: Not on file    Physically abused: Not on file    Forced sexual activity: Not on file  Other Topics Concern  . Not on file  Social History Narrative   Marital status: married x 28 years      Children: 3 children; no grandchildren      Lives: with wife      Employment:  Sales x 27 years; Education officer, [employer]; [employer] equipment. Mostly [place] travel      Tobacco: none       Alcohol: none       Exercises: jogging 3-4 miles 3-4 times per week      Seatbelt: 100%; some texting while driving  Allergies: Aspirin and Ibuprofen  Medications: I have reviewed the patient's current medications.  Vital Signs: No data found.  Radiology: No results found.  Labs: Recent Labs    08/19/18 0810  WBC 5.8  RBC 4.48  HCT 43.8  PLT 246   Recent Labs    08/19/18 0810  NA 141  K 3.8  CL 106  CO2 28  BUN 12  CREATININE 1.10  GLUCOSE 72  CALCIUM 9.4   No results for input(s): LABPT, INR in the last 72 hours.  Review of Systems: ROS  Physical Exam: There is no height or weight on file to calculate BMI.  Physical Exam  Constitutional: He is oriented to person, place, and time. He appears well-developed and well-nourished.  Cardiovascular: Normal rate and regular rhythm.  Respiratory: Effort normal and breath sounds normal.  GI: Soft. Bowel sounds are normal.  Neurological: He is alert and oriented to person, place, and time.  Skin: Skin is warm and dry.  Psychiatric: He has a normal mood and affect. His behavior is normal. Judgment and thought content normal.   Neuro exam: 5 out of 5 strength in the left upper extremity.  Right upper extremity: 4 out of 5  deltoid and biceps strength.  5 out of 5 strength tricep, wrist extensor, grip strength.  Positive Lhermitte sign, positive Spurling sign right C5 dermatome.  Sensation light touch is decreased in the right C5 dermatome, also complains of dysesthesias in the C5 dermatome.  Negative Tinel's sign at the elbow and wrist.  1+ symmetrical deep tendon reflexes in the upper extremity, negative Hoffman test.  Normal gait pattern.  No significant shoulder, elbow, wrist pain with isolated joint range of motion.  Intact peripheral pulses in the upper extremity bilaterally.  Cervical MRI: completed on 08/01/18 was reviewed with the patient.  I have also reviewed the radiology report.  No cord signal changes.  Moderate to severe right C4-5 neural foraminal narrowing.  Combination of bone spur and hard disc osteophyte causing compression of the exiting right C5 nerve root.  Moderate degenerative changes at the remainder of the levels.  Most noted on the left side at C5-6.  No significant C6 neural compression.   Assessment and Plan: Risks and benefits of surgery were discussed with the patient. These include: Infection, bleeding, death, stroke, paralysis, ongoing or worse pain, need for additional surgery, nonunion, leak of spinal fluid, adjacent segment degeneration requiring additional fusion surgery. Pseudoarthrosis (nonunion)requiring supplemental posterior fixation. Throat pain, swallowing difficulties, hoarseness or change in voice.   With respect to disc replacement: Additional risks include heterotopic ossification, inability to place the disc due to technical issues requiring bailout to a fusion procedure.  Ronette Deter, PAC for Melina Schools, MD [facility] [phone]  Patient evaluated prior to surgery - no change in clinical exam since last office visit on 08/21/18 Procedure reviewed with patient and his wife.  Also reviewed risks and benefits Plan on C4/5 TDR

## 2018-08-27 ENCOUNTER — Encounter (HOSPITAL_COMMUNITY): Payer: Self-pay | Admitting: *Deleted

## 2018-08-27 ENCOUNTER — Ambulatory Visit (HOSPITAL_COMMUNITY): Payer: Managed Care, Other (non HMO)

## 2018-08-27 ENCOUNTER — Ambulatory Visit (HOSPITAL_COMMUNITY)
Admission: RE | Disposition: A | Discharge status: Home / Self Care | Payer: Self-pay | Source: Ambulatory Visit | Attending: Orthopedic Surgery

## 2018-08-27 ENCOUNTER — Ambulatory Visit (HOSPITAL_COMMUNITY): Payer: Managed Care, Other (non HMO) | Admitting: Physician Assistant

## 2018-08-27 ENCOUNTER — Observation Stay (HOSPITAL_COMMUNITY)
Admission: RE | Admit: 2018-08-27 | Discharge: 2018-08-28 | Disposition: A | Discharge status: Home / Self Care | Payer: Managed Care, Other (non HMO) | Source: Ambulatory Visit | Attending: Orthopedic Surgery | Admitting: Orthopedic Surgery

## 2018-08-27 ENCOUNTER — Other Ambulatory Visit: Payer: Self-pay

## 2018-08-27 DIAGNOSIS — Z8711 Personal history of peptic ulcer disease: Secondary | ICD-10-CM | POA: Diagnosis not present

## 2018-08-27 DIAGNOSIS — Z9889 Other specified postprocedural states: Secondary | ICD-10-CM

## 2018-08-27 DIAGNOSIS — K219 Gastro-esophageal reflux disease without esophagitis: Secondary | ICD-10-CM | POA: Diagnosis not present

## 2018-08-27 DIAGNOSIS — Z79899 Other long term (current) drug therapy: Secondary | ICD-10-CM | POA: Diagnosis not present

## 2018-08-27 DIAGNOSIS — Z85828 Personal history of other malignant neoplasm of skin: Secondary | ICD-10-CM | POA: Diagnosis not present

## 2018-08-27 DIAGNOSIS — Z886 Allergy status to analgesic agent status: Secondary | ICD-10-CM | POA: Diagnosis not present

## 2018-08-27 DIAGNOSIS — M50121 Cervical disc disorder at C4-C5 level with radiculopathy: Secondary | ICD-10-CM | POA: Diagnosis not present

## 2018-08-27 DIAGNOSIS — M5020 Other cervical disc displacement, unspecified cervical region: Secondary | ICD-10-CM

## 2018-08-27 DIAGNOSIS — M1990 Unspecified osteoarthritis, unspecified site: Secondary | ICD-10-CM | POA: Insufficient documentation

## 2018-08-27 DIAGNOSIS — M2578 Osteophyte, vertebrae: Secondary | ICD-10-CM | POA: Insufficient documentation

## 2018-08-27 HISTORY — PX: CERVICAL DISC ARTHROPLASTY: SHX587

## 2018-08-27 SURGERY — CERVICAL ANTERIOR DISC ARTHROPLASTY
Anesthesia: General | Site: Neck

## 2018-08-27 MED ORDER — PHENYLEPHRINE HCL 10 MG/ML IJ SOLN
INTRAMUSCULAR | Status: DC | PRN
  Administered 2018-08-27 (×2): 80 ug via INTRAVENOUS

## 2018-08-27 MED ORDER — MENTHOL 3 MG MT LOZG
1.0000 | LOZENGE | OROMUCOSAL | Status: DC | PRN
  Administered 2018-08-28: 3 mg via ORAL
  Filled 2018-08-27: qty 9

## 2018-08-27 MED ORDER — ACETAMINOPHEN 325 MG PO TABS
650.0000 mg | ORAL_TABLET | ORAL | Status: DC | PRN
  Administered 2018-08-27: 650 mg via ORAL
  Filled 2018-08-27: qty 2

## 2018-08-27 MED ORDER — FENTANYL CITRATE (PF) 100 MCG/2ML IJ SOLN
INTRAMUSCULAR | Status: DC | PRN
  Administered 2018-08-27 (×3): 50 ug via INTRAVENOUS

## 2018-08-27 MED ORDER — ONDANSETRON HCL 4 MG/2ML IJ SOLN
INTRAMUSCULAR | Status: DC | PRN
  Administered 2018-08-27: 4 mg via INTRAVENOUS

## 2018-08-27 MED ORDER — MAGNESIUM CITRATE PO SOLN: 1.0000 | Freq: Once | ORAL | Status: DC | PRN

## 2018-08-27 MED ORDER — BUPIVACAINE-EPINEPHRINE (PF) 0.25% -1:200000 IJ SOLN
INTRAMUSCULAR | Status: AC
  Filled 2018-08-27: qty 30

## 2018-08-27 MED ORDER — LACTATED RINGERS IV SOLN
INTRAVENOUS | Status: DC
  Administered 2018-08-27 (×2): via INTRAVENOUS

## 2018-08-27 MED ORDER — HYDROCODONE-ACETAMINOPHEN 10-325 MG PO TABS
1.0000 | ORAL_TABLET | ORAL | Status: DC | PRN
  Administered 2018-08-28 (×2): 1 via ORAL
  Filled 2018-08-27 (×2): qty 1

## 2018-08-27 MED ORDER — THROMBIN 20000 UNITS EX KIT
PACK | CUTANEOUS | Status: AC
  Filled 2018-08-27: qty 1

## 2018-08-27 MED ORDER — OXYCODONE HCL 5 MG/5ML PO SOLN: 5.0000 mg | Freq: Once | ORAL | Status: DC | PRN

## 2018-08-27 MED ORDER — OXYCODONE HCL 5 MG PO TABS
10.0000 mg | ORAL_TABLET | ORAL | Status: DC | PRN
  Administered 2018-08-27 – 2018-08-28 (×3): 10 mg via ORAL
  Filled 2018-08-27 (×3): qty 2

## 2018-08-27 MED ORDER — PROPOFOL 10 MG/ML IV BOLUS
INTRAVENOUS | Status: AC
  Filled 2018-08-27: qty 20

## 2018-08-27 MED ORDER — FENTANYL CITRATE (PF) 250 MCG/5ML IJ SOLN
INTRAMUSCULAR | Status: AC
  Filled 2018-08-27: qty 5

## 2018-08-27 MED ORDER — PROPOFOL 10 MG/ML IV BOLUS
INTRAVENOUS | Status: DC | PRN
  Administered 2018-08-27: 160 mg via INTRAVENOUS

## 2018-08-27 MED ORDER — OXYCODONE HCL 5 MG PO TABS: 5.0000 mg | ORAL_TABLET | Freq: Once | ORAL | Status: DC | PRN

## 2018-08-27 MED ORDER — MEPERIDINE HCL 50 MG/ML IJ SOLN: 6.2500 mg | INTRAMUSCULAR | Status: DC | PRN

## 2018-08-27 MED ORDER — METHOCARBAMOL 500 MG PO TABS
500.0000 mg | ORAL_TABLET | Freq: Four times a day (QID) | ORAL | Status: DC | PRN
  Administered 2018-08-27 – 2018-08-28 (×2): 500 mg via ORAL
  Filled 2018-08-27 (×2): qty 1

## 2018-08-27 MED ORDER — ACETAMINOPHEN 650 MG RE SUPP: 650.0000 mg | RECTAL | Status: DC | PRN

## 2018-08-27 MED ORDER — ONDANSETRON HCL 4 MG/2ML IJ SOLN
4.0000 mg | Freq: Four times a day (QID) | INTRAMUSCULAR | Status: DC | PRN
  Administered 2018-08-28: 4 mg via INTRAVENOUS
  Filled 2018-08-27: qty 2

## 2018-08-27 MED ORDER — 0.9 % SODIUM CHLORIDE (POUR BTL) OPTIME
TOPICAL | Status: DC | PRN
  Administered 2018-08-27: 1000 mL

## 2018-08-27 MED ORDER — HEMOSTATIC AGENTS (NO CHARGE) OPTIME
TOPICAL | Status: DC | PRN
  Administered 2018-08-27 (×2): 1 via TOPICAL

## 2018-08-27 MED ORDER — ONDANSETRON 4 MG PO TBDP: 4.0000 mg | ORAL_TABLET | Freq: Three times a day (TID) | ORAL | 0 refills | Status: DC | PRN

## 2018-08-27 MED ORDER — ONDANSETRON HCL 4 MG PO TABS: 4.0000 mg | ORAL_TABLET | Freq: Four times a day (QID) | ORAL | Status: DC | PRN

## 2018-08-27 MED ORDER — POLYETHYLENE GLYCOL 3350 17 G PO PACK: 17.0000 g | PACK | Freq: Every day | ORAL | Status: DC | PRN

## 2018-08-27 MED ORDER — MORPHINE SULFATE (PF) 2 MG/ML IV SOLN: 1.0000 mg | INTRAVENOUS | Status: DC | PRN

## 2018-08-27 MED ORDER — OMEPRAZOLE MAGNESIUM 20 MG PO TBEC: 20.0000 mg | DELAYED_RELEASE_TABLET | Freq: Every day | ORAL | Status: DC

## 2018-08-27 MED ORDER — DEXMEDETOMIDINE HCL IN NACL 200 MCG/50ML IV SOLN
INTRAVENOUS | Status: AC
  Filled 2018-08-27: qty 50

## 2018-08-27 MED ORDER — DEXAMETHASONE SODIUM PHOSPHATE 10 MG/ML IJ SOLN
INTRAMUSCULAR | Status: AC
  Filled 2018-08-27: qty 1

## 2018-08-27 MED ORDER — DOCUSATE SODIUM 100 MG PO CAPS
100.0000 mg | ORAL_CAPSULE | Freq: Two times a day (BID) | ORAL | Status: DC
  Administered 2018-08-27 – 2018-08-28 (×2): 100 mg via ORAL
  Filled 2018-08-27 (×2): qty 1

## 2018-08-27 MED ORDER — BUPIVACAINE-EPINEPHRINE 0.25% -1:200000 IJ SOLN
INTRAMUSCULAR | Status: DC | PRN
  Administered 2018-08-27: 5 mL

## 2018-08-27 MED ORDER — HYDROMORPHONE HCL 1 MG/ML IJ SOLN: 0.2500 mg | INTRAMUSCULAR | Status: DC | PRN

## 2018-08-27 MED ORDER — MIDAZOLAM HCL 2 MG/2ML IJ SOLN
INTRAMUSCULAR | Status: AC
  Filled 2018-08-27: qty 2

## 2018-08-27 MED ORDER — SODIUM CHLORIDE 0.9% FLUSH: 3.0000 mL | INTRAVENOUS | Status: DC | PRN

## 2018-08-27 MED ORDER — LIDOCAINE HCL 4 % EX SOLN
CUTANEOUS | Status: DC | PRN
  Administered 2018-08-27: 2 mL via TOPICAL

## 2018-08-27 MED ORDER — LIDOCAINE 2% (20 MG/ML) 5 ML SYRINGE
INTRAMUSCULAR | Status: AC
  Filled 2018-08-27: qty 10

## 2018-08-27 MED ORDER — DEXAMETHASONE SODIUM PHOSPHATE 10 MG/ML IJ SOLN
INTRAMUSCULAR | Status: DC | PRN
  Administered 2018-08-27: 8 mg via INTRAVENOUS

## 2018-08-27 MED ORDER — METHOCARBAMOL 500 MG PO TABS: 500.0000 mg | ORAL_TABLET | Freq: Three times a day (TID) | ORAL | 0 refills | Status: DC

## 2018-08-27 MED ORDER — PHENOL 1.4 % MT LIQD: 1.0000 | OROMUCOSAL | Status: DC | PRN

## 2018-08-27 MED ORDER — LACTATED RINGERS IV SOLN: INTRAVENOUS | Status: DC

## 2018-08-27 MED ORDER — SUCCINYLCHOLINE CHLORIDE 200 MG/10ML IV SOSY
PREFILLED_SYRINGE | INTRAVENOUS | Status: AC
  Filled 2018-08-27: qty 10

## 2018-08-27 MED ORDER — ROCURONIUM BROMIDE 50 MG/5ML IV SOSY
PREFILLED_SYRINGE | INTRAVENOUS | Status: AC
  Filled 2018-08-27: qty 10

## 2018-08-27 MED ORDER — SODIUM CHLORIDE 0.9% FLUSH: 3.0000 mL | Freq: Two times a day (BID) | INTRAVENOUS | Status: DC

## 2018-08-27 MED ORDER — CEFAZOLIN SODIUM-DEXTROSE 2-4 GM/100ML-% IV SOLN
INTRAVENOUS | Status: AC
  Filled 2018-08-27: qty 100

## 2018-08-27 MED ORDER — HYDROCODONE-ACETAMINOPHEN 10-325 MG PO TABS: 1.0000 | ORAL_TABLET | Freq: Four times a day (QID) | ORAL | 0 refills | Status: AC | PRN

## 2018-08-27 MED ORDER — SUGAMMADEX SODIUM 200 MG/2ML IV SOLN
INTRAVENOUS | Status: DC | PRN
  Administered 2018-08-27: 160 mg via INTRAVENOUS

## 2018-08-27 MED ORDER — PANTOPRAZOLE SODIUM 40 MG PO TBEC
40.0000 mg | DELAYED_RELEASE_TABLET | Freq: Every day | ORAL | Status: DC
  Administered 2018-08-28: 40 mg via ORAL
  Filled 2018-08-27: qty 1

## 2018-08-27 MED ORDER — METHOCARBAMOL 1000 MG/10ML IJ SOLN
500.0000 mg | Freq: Four times a day (QID) | INTRAVENOUS | Status: DC | PRN
  Filled 2018-08-27: qty 5

## 2018-08-27 MED ORDER — MIDAZOLAM HCL 5 MG/5ML IJ SOLN
INTRAMUSCULAR | Status: DC | PRN
  Administered 2018-08-27: 2 mg via INTRAVENOUS

## 2018-08-27 MED ORDER — PROMETHAZINE HCL 25 MG/ML IJ SOLN: 6.2500 mg | INTRAMUSCULAR | Status: DC | PRN

## 2018-08-27 MED ORDER — CEFAZOLIN SODIUM 1 G IJ SOLR
INTRAMUSCULAR | Status: AC
  Filled 2018-08-27: qty 20

## 2018-08-27 MED ORDER — CEFAZOLIN SODIUM-DEXTROSE 2-4 GM/100ML-% IV SOLN
2.0000 g | Freq: Three times a day (TID) | INTRAVENOUS | Status: AC
  Administered 2018-08-27 – 2018-08-28 (×2): 2 g via INTRAVENOUS
  Filled 2018-08-27 (×2): qty 100

## 2018-08-27 MED ORDER — THROMBIN 20000 UNITS EX SOLR
CUTANEOUS | Status: DC | PRN
  Administered 2018-08-27: 20000 [IU] via TOPICAL

## 2018-08-27 MED ORDER — SODIUM CHLORIDE 0.9 % IV SOLN
INTRAVENOUS | Status: DC | PRN
  Administered 2018-08-27: 25 ug/min via INTRAVENOUS

## 2018-08-27 MED ORDER — SODIUM CHLORIDE 0.9 % IV SOLN: 250.0000 mL | INTRAVENOUS | Status: DC

## 2018-08-27 MED ORDER — ROCURONIUM BROMIDE 50 MG/5ML IV SOSY
PREFILLED_SYRINGE | INTRAVENOUS | Status: DC | PRN
  Administered 2018-08-27: 10 mg via INTRAVENOUS
  Administered 2018-08-27: 50 mg via INTRAVENOUS
  Administered 2018-08-27: 10 mg via INTRAVENOUS
  Administered 2018-08-27: 20 mg via INTRAVENOUS

## 2018-08-27 MED ORDER — LIDOCAINE 2% (20 MG/ML) 5 ML SYRINGE
INTRAMUSCULAR | Status: DC | PRN
  Administered 2018-08-27: 50 mg via INTRAVENOUS

## 2018-08-27 MED ORDER — CEFAZOLIN SODIUM-DEXTROSE 2-4 GM/100ML-% IV SOLN
2.0000 g | INTRAVENOUS | Status: AC
  Administered 2018-08-27: 2 g via INTRAVENOUS

## 2018-08-27 MED ORDER — ONDANSETRON HCL 4 MG/2ML IJ SOLN
INTRAMUSCULAR | Status: AC
  Filled 2018-08-27: qty 2

## 2018-08-27 MED ORDER — PHENYLEPHRINE 40 MCG/ML (10ML) SYRINGE FOR IV PUSH (FOR BLOOD PRESSURE SUPPORT)
PREFILLED_SYRINGE | INTRAVENOUS | Status: AC
  Filled 2018-08-27: qty 10

## 2018-08-27 MED ORDER — ROCURONIUM BROMIDE 50 MG/5ML IV SOSY
PREFILLED_SYRINGE | INTRAVENOUS | Status: AC
  Filled 2018-08-27: qty 5

## 2018-08-27 SURGICAL SUPPLY — 62 items
BIT MILLING PRODISC 2.0 STER (BIT) ×3 IMPLANT
BLADE CLIPPER SURG (BLADE) IMPLANT
CANISTER SUCT 3000ML PPV (MISCELLANEOUS) ×3 IMPLANT
CLOSURE STERI-STRIP 1/2X4 (GAUZE/BANDAGES/DRESSINGS) ×1
CLOSURE WOUND 1/2 X4 (GAUZE/BANDAGES/DRESSINGS) ×1
CLSR STERI-STRIP ANTIMIC 1/2X4 (GAUZE/BANDAGES/DRESSINGS) ×2 IMPLANT
COLLAR CERV PROCARE ST 2.25 (SOFTGOODS) ×3 IMPLANT
COVER MAYO STAND STRL (DRAPES) ×9 IMPLANT
COVER SURGICAL LIGHT HANDLE (MISCELLANEOUS) ×3 IMPLANT
CRADLE DONUT ADULT HEAD (MISCELLANEOUS) ×3 IMPLANT
DISC PRODISC-C MED DEEP 5MM (Neuro Prosthesis/Implant) ×3 IMPLANT
DRAPE C-ARM 42X72 X-RAY (DRAPES) ×3 IMPLANT
DRAPE C-ARMOR (DRAPES) IMPLANT
DRAPE POUCH INSTRU U-SHP 10X18 (DRAPES) ×3 IMPLANT
DRAPE SURG 17X23 STRL (DRAPES) ×3 IMPLANT
DRAPE U-SHAPE 47X51 STRL (DRAPES) ×3 IMPLANT
DRSG OPSITE 4X5.5 SM (GAUZE/BANDAGES/DRESSINGS) ×3 IMPLANT
DRSG OPSITE POSTOP 3X4 (GAUZE/BANDAGES/DRESSINGS) ×3 IMPLANT
DURAPREP 26ML APPLICATOR (WOUND CARE) ×3 IMPLANT
ELECT COATED BLADE 2.86 ST (ELECTRODE) ×3 IMPLANT
ELECT PENCIL ROCKER SW 15FT (MISCELLANEOUS) ×3 IMPLANT
ELECT REM PT RETURN 9FT ADLT (ELECTROSURGICAL) ×3
ELECTRODE REM PT RTRN 9FT ADLT (ELECTROSURGICAL) ×1 IMPLANT
GLOVE BIO SURGEON STRL SZ 6.5 (GLOVE) ×2 IMPLANT
GLOVE BIO SURGEONS STRL SZ 6.5 (GLOVE) ×1
GLOVE BIOGEL PI IND STRL 6.5 (GLOVE) ×1 IMPLANT
GLOVE BIOGEL PI IND STRL 8.5 (GLOVE) ×1 IMPLANT
GLOVE BIOGEL PI INDICATOR 6.5 (GLOVE) ×2
GLOVE BIOGEL PI INDICATOR 8.5 (GLOVE) ×2
GLOVE SS BIOGEL STRL SZ 8.5 (GLOVE) ×1 IMPLANT
GLOVE SUPERSENSE BIOGEL SZ 8.5 (GLOVE) ×2
GOWN STRL REUS W/ TWL LRG LVL3 (GOWN DISPOSABLE) ×2 IMPLANT
GOWN STRL REUS W/TWL 2XL LVL3 (GOWN DISPOSABLE) ×3 IMPLANT
GOWN STRL REUS W/TWL LRG LVL3 (GOWN DISPOSABLE) ×4
KIT BASIN OR (CUSTOM PROCEDURE TRAY) ×3 IMPLANT
KIT TURNOVER KIT B (KITS) ×3 IMPLANT
NEEDLE SPNL 18GX3.5 QUINCKE PK (NEEDLE) ×3 IMPLANT
NS IRRIG 1000ML POUR BTL (IV SOLUTION) ×3 IMPLANT
NUT RETAINER F/PRODISC (ORTHOPEDIC DISPOSABLE SUPPLIES) ×6 IMPLANT
PACK ORTHO CERVICAL (CUSTOM PROCEDURE TRAY) ×3 IMPLANT
PACK UNIVERSAL I (CUSTOM PROCEDURE TRAY) ×3 IMPLANT
PAD ARMBOARD 7.5X6 YLW CONV (MISCELLANEOUS) ×6 IMPLANT
RESTRAINT LIMB HOLDER UNIV (RESTRAINTS) ×3 IMPLANT
SCREW PRODISC RETAINER 3.5X14 (PIN) ×6 IMPLANT
SPONGE INTESTINAL PEANUT (DISPOSABLE) IMPLANT
SPONGE SURGIFOAM ABS GEL SZ50 (HEMOSTASIS) ×3 IMPLANT
STRIP CLOSURE SKIN 1/2X4 (GAUZE/BANDAGES/DRESSINGS) ×2 IMPLANT
SURGIFLO W/THROMBIN 8M KIT (HEMOSTASIS) ×3 IMPLANT
SUT BONE WAX W31G (SUTURE) ×3 IMPLANT
SUT MNCRL AB 3-0 PS2 27 (SUTURE) ×3 IMPLANT
SUT SILK 3 0 TIES 17X18 (SUTURE)
SUT SILK 3-0 18XBRD TIE BLK (SUTURE) IMPLANT
SUT VIC AB 2-0 CT1 18 (SUTURE) ×3 IMPLANT
SYR BULB IRRIGATION 50ML (SYRINGE) ×3 IMPLANT
SYR CONTROL 10ML LL (SYRINGE) IMPLANT
TAPE CLOTH 4X10 WHT NS (GAUZE/BANDAGES/DRESSINGS) ×3 IMPLANT
TAPE UMBILICAL COTTON 1/8X30 (MISCELLANEOUS) ×3 IMPLANT
TIP INSERTER MEDIUM (INSTRUMENTS) ×3 IMPLANT
TOWEL OR 17X24 6PK STRL BLUE (TOWEL DISPOSABLE) ×3 IMPLANT
TOWEL OR 17X26 10 PK STRL BLUE (TOWEL DISPOSABLE) ×3 IMPLANT
WATER STERILE IRR 1000ML POUR (IV SOLUTION) ×3 IMPLANT
YANKAUER SUCT BULB TIP NO VENT (SUCTIONS) ×3 IMPLANT

## 2018-08-27 NOTE — Anesthesia Postprocedure Evaluation (Signed)
Anesthesia Post Note  Patient: Dustin Fischer  Procedure(s) Performed: CERVICAL ANTERIOR DISC ARTHROPLASTY C4-5 (N/A Neck)     Patient location during evaluation: PACU Anesthesia Type: General Level of consciousness: awake Pain management: pain level controlled Vital Signs Assessment: post-procedure vital signs reviewed and stable Respiratory status: spontaneous breathing Cardiovascular status: stable Anesthetic complications: no    Last Vitals:  Vitals:   08/27/18 1810 08/27/18 1815  BP: 131/71   Pulse: 87 84  Resp: 14 (!) 22  Temp: 36.7 C   SpO2: 95% 94%    Last Pain:  Vitals:   08/27/18 1810  TempSrc:   PainSc: 0-No pain                 Bree Heinzelman

## 2018-08-27 NOTE — Discharge Instructions (Signed)
Spinal Fusion, Care After °These instructions give you information about caring for yourself after your procedure. Your doctor may also give you more specific instructions. Call your doctor if you have any problems or questions after your procedure. °Follow these instructions at home: °Medicines °· Take over-the-counter and prescription medicines only as told by your doctor. These include any medicines for pain. °· Do not drive for 24 hours if you received a sedative. °· Do not drive or use heavy machinery while taking prescription pain medicine. °· If you were prescribed an antibiotic medicine, take it as told by your doctor. Do not stop taking the antibiotic even if you start to feel better. °Surgical Cut (Incision) Care °· Follow instructions from your doctor about how to take care of your surgical cut. Make sure you: °? Wash your hands with soap and water before you change your bandage (dressing). If you cannot use soap and water, use hand sanitizer. °? Change your bandage as told by your doctor. °? Leave stitches (sutures), skin glue, or skin tape (adhesive) strips in place. They may need to stay in place for 2 weeks or longer. If tape strips get loose and curl up, you may trim the loose edges. Do not remove tape strips completely unless your doctor says it is okay. °· Keep your surgical cut clean and dry. Do not take baths, swim, or use a hot tub until your doctor says it is okay. °· Check your surgical cut and the area around it every day for: °? Redness. °? Swelling. °? Fluid. °Physical Activity °· Return to your normal activities as told by your doctor. Ask your doctor what activities are safe for you. Rest and protect your back as much as you can. °· Follow instructions from your doctor about how to move. Use good posture to help your spine heal. °· Do not lift anything that is heavier than 8 lb (3.6 kg) or as told by your doctor until he or she says that it is safe. Do not lift anything over your  head. °· Do not twist or bend at the waist until your doctor says it is okay. °· Avoid pushing or pulling motions. °· Do not sit or lie down in the same position for long periods of time. °· Do not start to exercise until your doctor says it is okay. Ask your doctor what kinds of exercise you can do to make your back stronger. °General instructions °· If you were given a brace, use it as told by your doctor. °· Wear compression stockings as told by your doctor. °· Do not use tobacco products. These include cigarettes, chewing tobacco, or e-cigarettes. If you need help quitting, ask your doctor. °· Keep all follow-up visits as told by your doctor. This is important. This includes any visits with your physical therapist, if this applies. °Contact a doctor if: °· Your pain gets worse. °· Your medicine does not help your pain. °· Your legs or feet become painful or swollen. °· Your surgical cut is red, swollen, or painful. °· You have fluid, blood, or pus coming from your surgical cut. °· You feel sick to your stomach (nauseous). °· You throw up (vomit). °· Your have weakness or loss of feeling (numbness) in your legs that is new or getting worse. °· You have a fever. °· You have trouble controlling when you pee (urinate) or poop (have a bowel movement). °Get help right away if: °· Your pain is very bad. °· You have   chest pain. °· You have trouble breathing. °· You start to have a cough. °These symptoms may be an emergency. Do not wait to see if the symptoms will go away. Get medical help right away. Call your local emergency services (911 in the [place] Do not drive yourself to the hospital. °This information is not intended to replace advice given to you by your health care provider. Make sure you discuss any questions you have with your health care provider. °Document Released: 04/04/2011 Document Revised: 08/06/2016 Document Reviewed: 05/24/2015 °Elsevier Interactive Patient Education © 2018 [employer] Inc[employer] ° °

## 2018-08-27 NOTE — Anesthesia Procedure Notes (Signed)
Procedure Name: Intubation Date/Time: 08/27/2018 3:15 PM Performed by: Inda Coke, CRNA Pre-anesthesia Checklist: Patient identified, Emergency Drugs available, Suction available and Patient being monitored Patient Re-evaluated:Patient Re-evaluated prior to induction Oxygen Delivery Method: Circle System Utilized Preoxygenation: Pre-oxygenation with 100% oxygen Induction Type: IV induction Ventilation: Mask ventilation without difficulty and Oral airway inserted - appropriate to patient size Laryngoscope Size: Mac and 4 Grade View: Grade I Tube type: Oral Tube size: 7.5 mm Number of attempts: 1 Airway Equipment and Method: Stylet and Oral airway Placement Confirmation: ETT inserted through vocal cords under direct vision,  positive ETCO2 and breath sounds checked- equal and bilateral Secured at: 22 cm Tube secured with: Tape Dental Injury: Teeth and Oropharynx as per pre-operative assessment

## 2018-08-27 NOTE — OR Nursing (Signed)
Care of patient assumed at 1645.

## 2018-08-27 NOTE — Transfer of Care (Signed)
Immediate Anesthesia Transfer of Care Note  Patient: Dustin Fischer  Procedure(s) Performed: CERVICAL ANTERIOR DISC ARTHROPLASTY C4-5 (N/A Neck)  Patient Location: PACU  Anesthesia Type:General  Level of Consciousness: awake and drowsy  Airway & Oxygen Therapy: Patient Spontanous Breathing and Patient connected to nasal cannula oxygen  Post-op Assessment: Report given to RN, Post -op Vital signs reviewed and stable and Patient moving all extremities X 4  Post vital signs: Reviewed and stable  Last Vitals:  Vitals Value Taken Time  BP 131/71 08/27/2018  6:07 PM  Temp    Pulse 87 08/27/2018  6:09 PM  Resp 14 08/27/2018  6:09 PM  SpO2 95 % 08/27/2018  6:09 PM  Vitals shown include unvalidated device data.  Last Pain:  Vitals:   08/27/18 1243  TempSrc:   PainSc: 2          Complications: No apparent anesthesia complications

## 2018-08-27 NOTE — Op Note (Signed)
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  Implant: Prodisc C - 5 mm deep  First assistant: Ronette Deter, PA  Indications: This is a very pleasant 52 year old gentleman presented my office with significant radicular arm pain and dysesthesias in the C5 distribution.  Imaging studies confirmed disc herniation with nerve irritation at C4-5.  As a result of failure to improve with conservative measures we elected to move forward with surgery.  All appropriate risks benefits and alternatives were discussed with the patient and his wife and consent was obtained.  Operative report: Patient was brought the operating room placed supine on the operating room table.  After successful induction of general anesthesia and endotracheal intubation teds SCDs were applied and the anterior cervical spine was prepped and draped in a standard fashion.  Timeout was taken confirming patient procedure and all other important data.  Fluoroscopy was then brought into the lateral plane and identify the C4-5 disc and I marked out my incision on the skin surface.  This was then infiltrated with quarter percent Marcaine with epinephrine.  Transverse incision was made and sharp dissection was carried down to the platysma.  The platysma was dissected and then I continue to dissect sharply through the deep cervical fascia.  I maintained a lateral position to the sternocleidomastoid.  This was a standard Smith-Robinson approach to the anterior cervical spine.  As I dissected through the deep cervical and prevertebral fascia identified the carotid sheath and protected it laterally with a finger and then swept the esophagus and trachea to the right-hand side.  A high retractor was then placed and I used Kitner dissectors to remove the remaining prevertebral fascia and exposed the C4-5 disc space.  A  needle was placed into the space and an x-ray was taken to confirm I was at the appropriate level.  Once confirmed I mobilized the longus coli muscle using bipolar electrocautery from the mid body of C4 to the mid body of C5 bilaterally.  Gaspar self-retaining retractor was replaced underneath the longus coli muscle the endotracheal cuff was deflated I expanded the retracting system reinflated the endotracheal cuff.  An annulotomy was performed with a 15 blade scalpel and the bulk of the disc material was removed with pituitary Stann Mainland.  I then identified the midline of C4 and 5 using AP fluoroscopy and placed my all and broach the cortex.  I then placed 2 distraction pins parallel to the endplate surface and in the midportion of the vertebral body.  Once this was completed I distracted the intervertebral space and maintain the distraction with the distraction pin set.  I continued wheezing curettes to remove all of the disc material.  The osteophyte at the inferior aspect of C4 was resected with a 2 mm Kerrison Roger.  I then continued using a nerve hook and 1 mm Kerrison Roger to remove the posterior aspect of the disc and posterior annulus.  I then remove disc fragments from the posterior right corner consistent with what was seen on the MRI.  I then used a fine nerve hook to dissect through the PLL and then used my 1 mm Kerrison Roger to resect the PLL.  At this point I was able to get under the uncovertebral joints and adequately decompress this area as well.  At this time I was quite pleased with the discectomy.  I then used the trial implants and measured and  elected to use the 5 mm deep implant.  This was secured into the disc space and I confirmed its position both planes.  Once confirmed I then used a high-speed bur to create the thin cuts and then I remove the trialing device.  I irrigated out the wound copiously normal saline and made sure I cleaned out the bony cuts that I just made.  I then obtained  the 5 mm deep Prodisc C implant and malleted to the appropriate depth.  At this point the distraction pins were removed and I used bone wax to seal the bleeding bone edges.  I then irrigated copiously normal saline and used bipolar cautery and FloSeal to obtain hemostasis.  Final x-rays were taken which demonstrated satisfactory positioning of the implant in both planes.  Once I confirmed hemostasis I then returned the trachea and esophagus to midline and closed the platysma with interrupted 2-0 Vicryl sutures.  Skin was closed with 3-0 Monocryl.  Steri-Strips dry dressing and a soft collar were applied.  Patient was ultimately extubated transfer the PACU without incident.  The end of the case all needle sponge counts were correct.  There are no adverse intraoperative events.

## 2018-08-27 NOTE — Anesthesia Preprocedure Evaluation (Signed)
Anesthesia Evaluation  Patient identified by MRN, date of birth, ID band Patient awake    Reviewed: Allergy & Precautions, NPO status , Patient's Chart, lab work & pertinent test results  Airway Mallampati: II  TM Distance: >3 FB Neck ROM: Full    Dental no notable dental hx.    Pulmonary neg pulmonary ROS,    Pulmonary exam normal breath sounds clear to auscultation       Cardiovascular negative cardio ROS Normal cardiovascular exam Rhythm:Regular Rate:Normal     Neuro/Psych negative neurological ROS  negative psych ROS   GI/Hepatic negative GI ROS, Neg liver ROS, GERD  ,  Endo/Other  negative endocrine ROS  Renal/GU negative Renal ROS  negative genitourinary   Musculoskeletal negative musculoskeletal ROS (+) Arthritis , Osteoarthritis,    Abdominal   Peds negative pediatric ROS (+)  Hematology negative hematology ROS (+)   Anesthesia Other Findings   Reproductive/Obstetrics negative OB ROS                             Anesthesia Physical Anesthesia Plan  ASA: II  Anesthesia Plan: General   Post-op Pain Management:    Induction: Intravenous  PONV Risk Score and Plan: 2 and Ondansetron and Midazolam  Airway Management Planned: Oral ETT  Additional Equipment:   Intra-op Plan:   Post-operative Plan: Extubation in OR  Informed Consent: I have reviewed the patients History and Physical, chart, labs and discussed the procedure including the risks, benefits and alternatives for the proposed anesthesia with the patient or authorized representative who has indicated his/her understanding and acceptance.   Dental advisory given  Plan Discussed with: CRNA  Anesthesia Plan Comments:         Anesthesia Quick Evaluation

## 2018-08-27 NOTE — Brief Op Note (Signed)
08/27/2018  5:56 PM  PATIENT:  Dustin Fischer  52 y.o. 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

## 2018-08-28 ENCOUNTER — Encounter (HOSPITAL_COMMUNITY): Payer: Self-pay | Admitting: Orthopedic Surgery

## 2018-08-28 DIAGNOSIS — M50121 Cervical disc disorder at C4-C5 level with radiculopathy: Secondary | ICD-10-CM | POA: Diagnosis not present

## 2018-08-28 MED ORDER — HYDROXYZINE HCL 50 MG/ML IM SOLN
50.0000 mg | Freq: Once | INTRAMUSCULAR | Status: AC
  Administered 2018-08-28: 50 mg via INTRAMUSCULAR
  Filled 2018-08-28: qty 1

## 2018-08-28 MED FILL — Thrombin For Soln Kit 20000 Unit: CUTANEOUS | Qty: 1 | Status: AC

## 2018-08-28 NOTE — Progress Notes (Signed)
Patient is discharged from room 3C10 at this time. Alert and in stable condition. IV site d/c'd and instructions read to patient and spouse with understanding verbalized. Left unit via wheelchair with all belongings at side.  

## 2018-08-28 NOTE — Evaluation (Signed)
Physical Therapy Evaluation and Discharge Patient Details Name: Dustin Fischer MRN: [id number] DOB: 06-06-1966 Today's Date: 08/28/2018   History of Present Illness  This 52 y.o. male admitted for C4-5 ACDF.  PMH noncontributory   Clinical Impression  Patient evaluated by Physical Therapy with no further acute PT needs identified. Patient performing all mobility independently including walking hallway distances and negotiating 10 steps without a railing. Reviewed cervical precautions and positioning. All education has been completed and the patient has no further questions. No follow-up Physical Therapy or equipment needs. PT is signing off. Thank you for this referral.     Follow Up Recommendations No PT follow up    Equipment Recommendations  None recommended by PT    Recommendations for Other Services       Precautions / Restrictions Precautions Precautions: Cervical Precaution Booklet Issued: Yes (comment) Precaution Comments: reviewed handout and cervical precautions with pt and wife in addition to positioning when in bed Required Braces or Orthoses: Cervical Brace Cervical Brace: Soft collar Restrictions Weight Bearing Restrictions: No      Mobility  Bed Mobility Overal bed mobility: Modified Independent             General bed mobility comments: able to perform log roll technique  Transfers Overall transfer level: Independent Equipment used: None                Ambulation/Gait Ambulation/Gait assistance: Independent Gait Distance (Feet): 400 Feet Assistive device: None Gait Pattern/deviations: WFL(Within Functional Limits)        Stairs Stairs: Yes Stairs assistance: Independent Stair Management: No rails Number of Stairs: 10 General stair comments: step over step pattern  Wheelchair Mobility    Modified Rankin (Stroke Patients Only)       Balance Overall balance assessment: No apparent balance deficits (not formally assessed)                                            Pertinent Vitals/Pain Pain Assessment: Faces Faces Pain Scale: Hurts little more Pain Location: neck and Rt shoulder  Pain Descriptors / Indicators: Aching;Operative site guarding Pain Intervention(s): Monitored during session    Home Living Family/patient expects to be discharged to:: Private residence Living Arrangements: Spouse/significant other;Children Available Help at Discharge: Family Type of Home: House Home Access: Stairs to enter Entrance Stairs-Rails: None Entrance Stairs-Number of Steps: 2 Home Layout: Able to live on main level with bedroom/bathroom Home Equipment: None      Prior Function Level of Independence: Independent         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  He does report he was having weakness in Rt shoulder which made it difficult to store his luggage in overhead plane bins and to maneuver breif case in and out of his car      Hand Dominance   Dominant Hand: Left    Extremity/Trunk Assessment   Upper Extremity Assessment Upper Extremity Assessment: Defer to OT evaluation RUE Deficits / Details: Rt shoulder 3+/5    Lower Extremity Assessment Lower Extremity Assessment: RLE deficits/detail;LLE deficits/detail RLE Deficits / Details: 5/5 LLE Deficits / Details: 5/5    Cervical / Trunk Assessment Cervical / Trunk Assessment: Other exceptions Cervical / Trunk Exceptions: s/p cervical fusion   Communication   Communication: No difficulties  Cognition Arousal/Alertness: Awake/alert Behavior During Therapy: WFL for tasks assessed/performed Overall Cognitive Status: Within  Functional Limits for tasks assessed                                        General Comments General comments (skin integrity, edema, etc.): discussed ergonomics of computer screen/laptop as he hopes to work from home next week.  Discussed clearing this with MD, and proper positioning of  monitor to prevent neck flexion     Exercises Other Exercises Other Exercises: Pt with lateral deltoid weakness - instructed him on HEP shoulder abduction to 90* with good alignment of shoulder    Assessment/Plan    PT Assessment Patent does not need any further PT services  PT Problem List         PT Treatment Interventions      PT Goals (Current goals can be found in the Care Plan section)  Acute Rehab PT Goals Patient Stated Goal: to regain strength in shoulder and return to work  PT Goal Formulation: All assessment and education complete, DC therapy    Frequency     Barriers to discharge        Co-evaluation               AM-PAC PT "6 Clicks" Daily Activity  Outcome Measure Difficulty turning over in bed (including adjusting bedclothes, sheets and blankets)?: None Difficulty moving from lying on back to sitting on the side of the bed? : None Difficulty sitting down on and standing up from a chair with arms (e.g., wheelchair, bedside commode, etc,.)?: None Help needed moving to and from a bed to chair (including a wheelchair)?: None Help needed walking in hospital room?: None Help needed climbing 3-5 steps with a railing? : None 6 Click Score: 24    End of Session Equipment Utilized During Treatment: Cervical collar Activity Tolerance: Patient tolerated treatment well Patient left: in bed;with call bell/phone within reach;with family/visitor present Nurse Communication: Mobility status PT Visit Diagnosis: Difficulty in walking, not elsewhere classified (R26.2);Pain Pain - part of body: (cervical)    Time: 1607-3710 PT Time Calculation (min) (ACUTE ONLY): 22 min   Charges:   PT Evaluation $PT Eval Low Complexity: [place], PT, DPT Acute Rehabilitation Services Pager [phone] Office [phone]    Willy Eddy 08/28/2018, 11:17 AM

## 2018-08-28 NOTE — Progress Notes (Signed)
    Subjective: 1 Day Post-Op Procedure(s) (LRB): CERVICAL ANTERIOR DISC ARTHROPLASTY C4-5 (N/A) Patient reports pain as 2 on 0-10 scale.   Denies CP or SOB.  Voiding without difficulty. Positive flatus. Objective: Vital signs in last 24 hours: Temp:  [97.6 F (36.4 C)-98.4 F (36.9 C)] 98.1 F (36.7 C) (09/06 0456) Pulse Rate:  [58-88] 85 (09/06 0456) Resp:  [14-25] 18 (09/06 0456) BP: (104-136)/(64-86) 104/64 (09/06 0445) SpO2:  [94 %-100 %] 100 % (09/06 0456) Weight:  [76 kg] 76 kg (09/05 1908)  Intake/Output from previous day: 09/05 0701 - 09/06 0700 In: 1604.3 [I.V.:1400; IV Piggyback:204.3] Out: 125 [Urine:100; Blood:25] Intake/Output this shift: No intake/output data recorded.  Labs: No results for input(s): HGB in the last 72 hours. No results for input(s): WBC, RBC, HCT, PLT in the last 72 hours. No results for input(s): NA, K, CL, CO2, BUN, CREATININE, GLUCOSE, CALCIUM in the last 72 hours. No results for input(s): LABPT, INR in the last 72 hours.  Physical Exam: Neurologically intact ABD soft Sensation intact distally Dorsiflexion/Plantar flexion intact Incision: no drainage Compartment soft Body mass index is 22.1 kg/m. Cervical collar in place  Assessment/Plan: 1 Day Post-Op Procedure(s) (LRB): CERVICAL ANTERIOR DISC ARTHROPLASTY C4-5 (N/A) Advance diet Up with therapy Pt may DC after cleared by PT Post op meds on chart  Jamail Cullers, Darla Lesches for Dr. Melina Schools [facility] [phone] 08/28/2018, 7:28 AM

## 2018-08-28 NOTE — Evaluation (Signed)
Occupational Therapy Evaluation Patient Details Name: Dustin Fischer MRN: [id number] DOB: [birth year] Today's Date: 08/28/2018    History of Present Illness This 52 y.o. male admitted for C4-5 ACDF.  PMH noncontributory    Clinical Impression   Patient evaluated by Occupational Therapy with no further acute OT needs identified. All education has been completed and the patient has no further questions. All education completed.  Pt demonstrates good awareness of precautions.   See below for any follow-up Occupational Therapy or equipment needs. OT is signing off. Thank you for this referral.      Follow Up Recommendations  No OT follow up;Supervision - Intermittent    Equipment Recommendations  None recommended by OT    Recommendations for Other Services       Precautions / Restrictions Precautions Precautions: Cervical Precaution Booklet Issued: Yes (comment) Precaution Comments: reviewed handout and cervical precautions with pt and wife  Required Braces or Orthoses: Cervical Brace Cervical Brace: Soft collar Restrictions Weight Bearing Restrictions: No      Mobility Bed Mobility Overal bed mobility: Modified Independent             General bed mobility comments: Pt instructed in log rolling and is able to perform mod I   Transfers Overall transfer level: Modified independent                    Balance                                           ADL either performed or assessed with clinical judgement   ADL Overall ADL's : Needs assistance/impaired Eating/Feeding: Independent   Grooming: Wash/dry hands;Wash/dry face;Oral care;Brushing hair;Modified independent;Standing Grooming Details (indicate cue type and reason): reviewed safe technique for brushing teeth and shaving  Upper Body Bathing: Supervision/ safety;Standing   Lower Body Bathing: Supervison/ safety;Sit to/from stand Lower Body Bathing Details (indicate cue type and reason):  Discussed use of LH bath brush/sponge for LB bathing  Upper Body Dressing : Supervision/safety;Set up;Sitting   Lower Body Dressing: Supervision/safety;Sit to/from stand Lower Body Dressing Details (indicate cue type and reason): able to cross ankles over knees  Toilet Transfer: Modified Independent;Ambulation;Regular Toilet   Toileting- Clothing Manipulation and Hygiene: Modified independent;Sit to/from stand   Tub/ Banker: Modified independent   Functional mobility during ADLs: Modified independent       Vision         Perception     Praxis      Pertinent Vitals/Pain Pain Assessment: Faces Faces Pain Scale: Hurts little more Pain Location: neck and Rt shoulder  Pain Descriptors / Indicators: Aching;Operative site guarding Pain Intervention(s): Limited activity within patient's tolerance;Monitored during session;Repositioned     Hand Dominance Left   Extremity/Trunk Assessment Upper Extremity Assessment Upper Extremity Assessment: RUE deficits/detail RUE Deficits / Details: Rt shoulder 3+/5   Lower Extremity Assessment Lower Extremity Assessment: Defer to PT evaluation   Cervical / Trunk Assessment Cervical / Trunk Assessment: Other exceptions Cervical / Trunk Exceptions: s/p cervical fusion    Communication Communication Communication: No difficulties   Cognition Arousal/Alertness: Awake/alert Behavior During Therapy: WFL for tasks assessed/performed Overall Cognitive Status: Within Functional Limits for tasks assessed  General Comments  discussed ergonomics of computer screen/laptop as he hopes to work from home next week.  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  He does report he was having weakness in Rt shoulder which made it difficult to store his luggage in overhead plane bins and to maneuver breif case in and out of his car         OT Problem List: Decreased strength;Decreased activity tolerance;Pain;Impaired UE functional use      OT Treatment/Interventions:      OT Goals(Current goals can be found in the care plan section) Acute Rehab OT Goals Patient Stated Goal: to regain strength in shoulder and return to work  OT Goal Formulation: All assessment and education complete, DC therapy  OT Frequency:     Barriers to D/C:            Co-evaluation              AM-PAC PT "6 Clicks" Daily Activity     Outcome Measure Help from another person eating meals?: None Help from another person taking care of personal grooming?: None Help from another person toileting, which includes using toliet, bedpan, or urinal?: None Help from another person bathing (including washing, rinsing, drying)?: None Help from another person to put on and taking off regular upper body clothing?: None Help from another person to put on and taking off regular lower body clothing?: None 6 Click Score: 24   End of Session Equipment Utilized During Treatment: Cervical collar Nurse Communication: Mobility status  Activity Tolerance: Patient tolerated treatment well Patient left: in bed;with call bell/phone within reach;with family/visitor present  OT Visit Diagnosis: Pain Pain - Right/Left: Right Pain - part of body: Shoulder                Time: 0813-0829 OT Time  Calculation (min): 16 min Charges:  OT General Charges $OT Visit: 1 Visit OT Evaluation $OT Eval Low Complexity: 1 Low  Lucille Passy, OTR/L Acute Rehabilitation Services Pager [phone] Office [phone]   Lucille Passy M 08/28/2018, 10:28 AM

## 2018-09-02 NOTE — Discharge Summary (Signed)
Physician Discharge Summary  Patient ID: Dustin Fischer MRN: [id number] DOB/AGE: December 21, 1966 52 y.o.  Admit date: 08/27/2018 Discharge date: 08/28/18  Admission Diagnoses:  C4-5 Disc herniation  Discharge Diagnoses:  Active Problems:   S/P cervical disc replacement   Past Medical History:  Diagnosis Date  . Arthritis    back   . Basal cell carcinoma, face   . GERD (gastroesophageal reflux disease)   . Ulcer of abdomen wall ([place])    childhood PUD    Surgeries: Procedure(s): CERVICAL ANTERIOR DISC ARTHROPLASTY C4-5 on 08/27/2018   Consultants (if any):   Discharged Condition: Improved  Hospital Course: BENZION MESTA is an 52 y.o. male who was admitted 08/27/2018 with a diagnosis of C4-5 disc herniation and went to the operating room on 08/27/2018 and underwent the above named procedures.  Post op day 1 pt reports a low level of pain controlled on oral medication. Pt is ambulating in hallway.  Pt is urinating w/o difficulty.  Pt is cleared by PT for DC.   He was given perioperative antibiotics:  Anti-infectives (From admission, onward)   Start     Dose/Rate Route Frequency Ordered Stop   08/27/18 2300  ceFAZolin (ANCEF) IVPB 2g/100 mL premix     2 g 200 mL/hr over 30 Minutes Intravenous Every 8 hours 08/27/18 1908 08/28/18 0538   08/27/18 1232  ceFAZolin (ANCEF) 2-4 GM/100ML-% IVPB    Note to Pharmacy:  Nyoka Cowden   : cabinet override      08/27/18 1232 08/27/18 1520   08/27/18 1226  ceFAZolin (ANCEF) IVPB 2g/100 mL premix     2 g 200 mL/hr over 30 Minutes Intravenous 30 min pre-op 08/27/18 1226 08/27/18 1520    .  He was given sequential compression devices, early ambulation, and TED for DVT prophylaxis.  He benefited maximally from the hospital stay and there were no complications.    Recent vital signs:  Vitals:   08/28/18 0456 08/28/18 0747  BP:  114/68  Pulse: 85 70  Resp: 18 18  Temp: 98.1 F (36.7 C) 97.8 F (36.6 C)  SpO2: 100% 97%    Recent laboratory  studies:  Lab Results  Component Value Date   HGB 14.3 08/19/2018   HGB 13.8 08/03/2018   HGB 14.6 08/17/2016   Lab Results  Component Value Date   WBC 5.8 08/19/2018   PLT 246 08/19/2018   No results found for: INR Lab Results  Component Value Date   NA 141 08/19/2018   K 3.8 08/19/2018   CL 106 08/19/2018   CO2 28 08/19/2018   BUN 12 08/19/2018   CREATININE 1.10 08/19/2018   GLUCOSE 72 08/19/2018    Discharge Medications:   Allergies as of 08/28/2018      Reactions   Aspirin Other (See Comments)   Internal bleeding as a child   Ibuprofen Other (See Comments)   Stomach ulcer      Medication List    STOP taking these medications   acetaminophen 500 MG tablet Commonly known as:  TYLENOL   cyclobenzaprine 10 MG tablet Commonly known as:  FLEXERIL   NON FORMULARY     TAKE these medications   methocarbamol 500 MG tablet Commonly known as:  ROBAXIN Take 1 tablet (500 mg total) by mouth 3 (three) times daily.   omeprazole 20 MG tablet Commonly known as:  PRILOSEC OTC Take 20 mg by mouth daily.   ondansetron 4 MG disintegrating tablet Commonly known as:  ZOFRAN-ODT Take  1 tablet (4 mg total) by mouth every 8 (eight) hours as needed for nausea or vomiting.     ASK your doctor about these medications   HYDROcodone-acetaminophen 10-325 MG tablet Commonly known as:  NORCO Take 1 tablet by mouth every 6 (six) hours as needed for up to 5 days. Ask about: Should I take this medication?       Diagnostic Studies: Dg Cervical Spine 2-3 Views  Result Date: 08/27/2018 CLINICAL DATA:  52 year old male undergoing anterior cervical disc arthroplasty at C4-C5 EXAM: CERVICAL SPINE - 2-3 VIEW; DG C-ARM 61-120 MIN COMPARISON:  None. FINDINGS: AP and cross-table lateral radiographs obtained and submitted for review. The images demonstrate placement of an artificial disc at C4-C5. The disc is well centered. Normal cervical alignment. The patient is intubated. No immediate  complication. IMPRESSION: In progress anterior C4-C5 disc arthroplasty. Electronically Signed   By: Jacqulynn Cadet M.D.   On: 08/27/2018 18:15   Dg C-arm 1-60 Min  Result Date: 08/27/2018 CLINICAL DATA:  52 year old male undergoing anterior cervical disc arthroplasty at C4-C5 EXAM: CERVICAL SPINE - 2-3 VIEW; DG C-ARM 61-120 MIN COMPARISON:  None. FINDINGS: AP and cross-table lateral radiographs obtained and submitted for review. The images demonstrate placement of an artificial disc at C4-C5. The disc is well centered. Normal cervical alignment. The patient is intubated. No immediate complication. IMPRESSION: In progress anterior C4-C5 disc arthroplasty. Electronically Signed   By: Jacqulynn Cadet M.D.   On: 08/27/2018 18:15   Dg C-arm 1-60 Min  Result Date: 08/27/2018 CLINICAL DATA:  52 year old male undergoing anterior cervical disc arthroplasty at C4-C5 EXAM: CERVICAL SPINE - 2-3 VIEW; DG C-ARM 61-120 MIN COMPARISON:  None. FINDINGS: AP and cross-table lateral radiographs obtained and submitted for review. The images demonstrate placement of an artificial disc at C4-C5. The disc is well centered. Normal cervical alignment. The patient is intubated. No immediate complication. IMPRESSION: In progress anterior C4-C5 disc arthroplasty. Electronically Signed   By: Jacqulynn Cadet M.D.   On: 08/27/2018 18:15    Disposition:  Pt will present to clinic 2 weeks post op Post op med provided  Discharge Instructions    Incentive spirometry RT   Complete by:  As directed       Follow-up Information    Melina Schools, MD Follow up in 2 week(s).   Specialty:  Orthopedic Surgery Contact information: [address] [place] [address] [phone]            Signed: Valinda Hoar 09/02/2018, 8:12 AM

## 2018-11-04 ENCOUNTER — Encounter: Payer: Self-pay | Admitting: Family Medicine

## 2018-11-04 ENCOUNTER — Ambulatory Visit (INDEPENDENT_AMBULATORY_CARE_PROVIDER_SITE_OTHER): Payer: Managed Care, Other (non HMO) | Admitting: Family Medicine

## 2018-11-04 ENCOUNTER — Other Ambulatory Visit: Payer: Self-pay

## 2018-11-04 VITALS — BP 133/83 | HR 70 | Temp 97.6°F | Resp 16 | Ht 73.0 in | Wt 175.2 lb

## 2018-11-04 DIAGNOSIS — Z13220 Encounter for screening for lipoid disorders: Secondary | ICD-10-CM

## 2018-11-04 DIAGNOSIS — Z125 Encounter for screening for malignant neoplasm of prostate: Secondary | ICD-10-CM | POA: Diagnosis not present

## 2018-11-04 DIAGNOSIS — Z1321 Encounter for screening for nutritional disorder: Secondary | ICD-10-CM | POA: Diagnosis not present

## 2018-11-04 DIAGNOSIS — Z130 Encounter for screening for diseases of the blood and blood-forming organs and certain disorders involving the immune mechanism: Secondary | ICD-10-CM | POA: Diagnosis not present

## 2018-11-04 DIAGNOSIS — Z131 Encounter for screening for diabetes mellitus: Secondary | ICD-10-CM

## 2018-11-04 DIAGNOSIS — Z0000 Encounter for general adult medical examination without abnormal findings: Secondary | ICD-10-CM

## 2018-11-04 DIAGNOSIS — Z79899 Other long term (current) drug therapy: Secondary | ICD-10-CM

## 2018-11-04 NOTE — Patient Instructions (Addendum)
Let me know if you change your mind on the flu shot.   I recommend screening eye exam and schedule appointment with your dermatologist.   If sugar level or cholesterol is elevated - can order fasting lab visit to repeat those levels.   Thanks for coming in today.   Keeping you healthy  Get these tests  Blood pressure- Have your blood pressure checked once a year by your healthcare provider.  Normal blood pressure is 120/80  Weight- Have your body mass index (BMI) calculated to screen for obesity.  BMI is a measure of body fat based on height and weight. You can also calculate your own BMI at ViewBanking.si.  Cholesterol- Have your cholesterol checked every year.  Diabetes- Have your blood sugar checked regularly if you have high blood pressure, high cholesterol, have a family history of diabetes or if you are overweight.  Screening for Colon Cancer- Colonoscopy starting at age 73.  Screening may begin sooner depending on your family history and other health conditions. Follow up colonoscopy as directed by your Gastroenterologist.  Screening for Prostate Cancer- Both blood work (PSA) and a rectal exam help screen for Prostate Cancer.  Screening begins at age 51 with African-American men and at age 77 with Caucasian men.  Screening may begin sooner depending on your family history.  Take these medicines  Aspirin- One aspirin daily can help prevent Heart disease and Stroke.  Flu shot- Every fall.  Tetanus- Every 10 years.  Zostavax- Once after the age of 29 to prevent Shingles.  Pneumonia shot- Once after the age of 84; if you are younger than 48, ask your healthcare provider if you need a Pneumonia shot.  Take these steps  Don't smoke- If you do smoke, talk to your doctor about quitting.  For tips on how to quit, go to www.smokefree.gov or call 1-800-QUIT-NOW.  Be physically active- Exercise 5 days a week for at least 30 minutes.  If you are not already physically  active start slow and gradually work up to 30 minutes of moderate physical activity.  Examples of moderate activity include walking briskly, mowing the yard, dancing, swimming, bicycling, etc.  Eat a healthy diet- Eat a variety of healthy food such as fruits, vegetables, low fat milk, low fat cheese, yogurt, lean meant, poultry, fish, beans, tofu, etc. For more information go to www.thenutritionsource.org  Drink alcohol in moderation- Limit alcohol intake to less than two drinks a day. Never drink and drive.  Dentist- Brush and floss twice daily; visit your dentist twice a year.  Depression- Your emotional health is as important as your physical health. If you're feeling down, or losing interest in things you would normally enjoy please talk to your healthcare provider.  Eye exam- Visit your eye doctor every year.  Safe sex- If you may be exposed to a sexually transmitted infection, use a condom.  Seat belts- Seat belts can save your life; always wear one.  Smoke/Carbon Monoxide detectors- These detectors need to be installed on the appropriate level of your home.  Replace batteries at least once a year.  Skin cancer- When out in the sun, cover up and use sunscreen 15 SPF or higher.  Violence- If anyone is threatening you, please tell your healthcare provider.  Living Will/ Health care power of attorney- Speak with your healthcare provider and family.  If you have lab work done today you will be contacted with your lab results within the next 2 weeks.  If you have  not heard from [place] then please contact us. The fastest way to get your results is to register for My Chart.   IF you received an x-ray today, you will receive an invoice from [facility]. Please contact [facility] at [phone] with questions or concerns regarding your invoice.   IF you received labwork today, you will receive an invoice from [place]. Please contact [facility] at [phone] with questions  or concerns regarding your invoice.   Our billing staff will not be able to assist you with questions regarding bills from these companies.  You will be contacted with the lab results as soon as they are available. The fastest way to get your results is to activate your My Chart account. Instructions are located on the last page of this paperwork. If you have not heard from [place] regarding the results in 2 weeks, please contact this office.

## 2018-11-04 NOTE — Progress Notes (Signed)
Subjective:  By signing my name below, I, Moises Blood, attest that this documentation has been prepared under the direction and in the presence of Merri Ray, MD. Electronically Signed: Moises Blood, [place]. 11/04/2018 , 8:33 AM .  Patient was seen in Room 13 .   Patient ID: Dustin Fischer, male    DOB: 06/09/1966, 52 y.o.   MRN: [id number] Chief Complaint  Patient presents with  . Annual Exam    in good health   HPI Dustin Fischer is a 52 y.o. male Here for annual physical. He has a history of GERD, low back pain and hearing difficulty. His last physical was in Aug 2017, but I did see him on Aug 12th for pre-op evaluation. He isn't fasting this morning; last meal at 7:00 AM.   Lipid screening His lipid panel in 2017 was normal.   Neck discomfort He had HNP treated by Dr. 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 He's having some muscle pains, but is going through physical therapy for it.   GERD/peptic ulcer disease He takes omeprazole 20 mg qd. He denies blood in stool or abdominal pain.   Cancer Screening Colonoscopy: last done on 09/27/16, repeat in 10 years.  Prostate cancer screening: PSA of 0.4 on 08/17/16.  Lab Results  Component Value Date   PSA 0.4 08/17/2016   Skin cancer screening: he's had previous basal cells removed, last over his right cheek; he hasn't seen his dermatologist in a long time.   Immunizations Immunization History  Administered Date(s) Administered  . Tdap 08/31/2014   Flu shot: he declines flu shot today.   Depression Depression screen [facility] 2/9 11/04/2018 08/03/2018 04/15/2018 08/17/2016  Decreased Interest 0 0 0 0  Down, Depressed, Hopeless 0 0 0 0  PHQ - 2 Score 0 0 0 0   Hearing difficulty He wears hearing aid just on the right side.   Vision No exam data present    He has glasses, but rarely wears them.   Dentist He's seen by dentist every 6 months.   Exercise He exercises regularly, about 5  days a week. He hasn't done so recently due to surgery, but plans on resuming after.   Patient Active Problem List   Diagnosis Date Noted  . S/P cervical disc replacement 08/27/2018  . Gastroesophageal reflux disease without esophagitis 08/17/2016  . Bilateral low back pain without sciatica 08/17/2016  . Hearing loss 08/17/2016  . Basal cell carcinoma 08/17/2016   Past Medical History:  Diagnosis Date  . Arthritis    back   . Basal cell carcinoma, face   . GERD (gastroesophageal reflux disease)   . Ulcer of abdomen wall ([place])    childhood PUD   Past Surgical History:  Procedure Laterality Date  . CERVICAL DISC ARTHROPLASTY N/A 08/27/2018   Procedure: CERVICAL ANTERIOR [place] ARTHROPLASTY C4-5;  Surgeon: Melina Schools, MD;  Location: [place];  Service: Orthopedics;  Laterality: N/A;  2.5 hrs  . ESOPHAGEAL DILATION    . HERNIA REPAIR    . UPPER GASTROINTESTINAL ENDOSCOPY    . WISDOM TOOTH EXTRACTION     Allergies  Allergen Reactions  . Aspirin Other (See Comments)    Internal bleeding as a child  . Ibuprofen Other (See Comments)    Stomach ulcer   Prior to Admission medications   Medication Sig Start Date End Date Taking? Authorizing Provider  omeprazole (PRILOSEC OTC) 20 MG tablet Take 20 mg by mouth daily.  Yes [provider]  methocarbamol (ROBAXIN) 500 MG tablet Take 1 tablet (500 mg total) by mouth 3 (three) times daily. Patient not taking: Reported on 11/04/2018 08/27/18   Mayo, Darla Lesches, PA-C  ondansetron (ZOFRAN ODT) 4 MG disintegrating tablet Take 1 tablet (4 mg total) by mouth every 8 (eight) hours as needed for nausea or vomiting. Patient not taking: Reported on 11/04/2018 08/27/18   Mayo, Darla Lesches, PA-C   Social History   Socioeconomic History  . Marital status: Married    Spouse name: Not on file  . Number of children: Not on file  . Years of education: Not on file  . Highest education level: Not on file  Occupational History  . Not on  file  Social Needs  . Financial resource strain: Not on file  . Food insecurity:    Worry: Not on file    Inability: Not on file  . Transportation needs:    Medical: Not on file    Non-medical: Not on file  Tobacco Use  . Smoking status: Never Smoker  . Smokeless tobacco: Never Used  Substance and Sexual Activity  . Alcohol use: No  . Drug use: No  . Sexual activity: Never  Lifestyle  . Physical activity:    Days per week: Not on file    Minutes per session: Not on file  . Stress: Not on file  Relationships  . Social connections:    Talks on phone: Not on file    Gets together: Not on file    Attends religious service: Not on file    Active member of club or organization: Not on file    Attends meetings of clubs or organizations: Not on file    Relationship status: Not on file  . Intimate partner violence:    Fear of current or ex partner: Not on file    Emotionally abused: Not on file    Physically abused: Not on file    Forced sexual activity: Not on file  Other Topics Concern  . 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 He appears well-developed and well-nourished.  HENT:  Head: Normocephalic and atraumatic.  Right Ear: External ear normal.  Left Ear: External ear normal.  Mouth/Throat: Oropharynx is clear and moist.  Eyes: Pupils are equal, round, and reactive to light. Conjunctivae and EOM are normal.  Neck: Normal range of motion. Neck supple. No thyromegaly present.    Cardiovascular: Normal rate, regular rhythm, normal heart sounds and intact distal pulses.  Pulmonary/Chest: Effort normal and breath sounds normal. No respiratory distress. He has no wheezes.  Abdominal: Soft. He exhibits no distension. There is no tenderness. Hernia confirmed negative in the right inguinal area and confirmed negative in the left inguinal area.  Genitourinary: Prostate normal.  Musculoskeletal: Normal range of motion. He exhibits no edema or tenderness.  Lymphadenopathy:    He has no cervical adenopathy.  Neurological: He is alert and oriented to person, place, and time. He has normal reflexes.  Skin: Skin is warm and dry.  Psychiatric: He has a normal mood and affect. His behavior is normal.  Vitals reviewed.     Vitals:   11/04/18 0805  BP: 133/83  Pulse: 70  Resp: 16  Temp: 97.6 F (36.4 C)  TempSrc: Oral  SpO2: 99%  Weight: 175 lb 3.2 oz (79.5 kg)  Height: 6\' 1"  (1.854 m)       Assessment & Plan:   Dustin Fischer is a 52 y.o. male Annual physical exam  - -anticipatory guidance as below in AVS, screening labs above. Health maintenance items as above in HPI discussed/recommended as applicable.  Recommend ophthalmology exam as well as follow-up with dermatologist with prior history of basal cell skin cancer.  Current use of proton pump inhibitor - Plan: VITAMIN D 25 Hydroxy (Vit-D Deficiency, Fractures), Vitamin B12, Magnesium, Ferritin Encounter for special screening examination for nutritional disorder - Plan: VITAMIN D 25 Hydroxy (Vit-D Deficiency, Fractures), Vitamin B12, Magnesium Screening, iron deficiency anemia - Plan: Ferritin  -History of peptic ulcer disease.  Tolerating omeprazole daily without new symptoms.  Screen for deficiencies as above.  Screening for prostate cancer - Plan: PSA  - We discussed pros and cons of prostate cancer screening, and after this discussion, he chose to have screening done. PSA obtained, and no concerning findings  on DRE.   Screening for hyperlipidemia - Plan: Lipid panel  Screening for diabetes mellitus - Plan: Comprehensive metabolic panel  Labs obtained, but if elevated glucose or lipids, repeat fasting.  No orders of the defined types were placed in this encounter.  Patient Instructions     Let me know if you change your mind on the flu shot.   I recommend screening eye exam and schedule appointment with your dermatologist.   If sugar level or cholesterol is elevated - can order fasting lab visit to repeat those levels.   Thanks for coming in today.   Keeping you healthy  Get these tests  Blood pressure- Have your blood pressure checked once a year by your healthcare provider.  Normal blood pressure is 120/80  Weight- Have your body mass index (BMI) calculated to screen for obesity.  BMI is a measure of body fat based on height and weight. You can also calculate your own BMI at ViewBanking.si.  Cholesterol- Have your cholesterol checked every year.  Diabetes- Have your blood sugar checked regularly if you have high blood pressure, high cholesterol, have a family history of diabetes or if you are overweight.  Screening for Colon Cancer- Colonoscopy starting at age 70.  Screening may begin sooner depending on your family history and other health conditions. Follow up colonoscopy as directed by your Gastroenterologist.  Screening for Prostate Cancer- Both blood work (PSA) and a rectal exam help screen for Prostate Cancer.  Screening begins at age 64 with African-American men and at age 15 with Caucasian men.  Screening may begin sooner depending on your family history.  Take these medicines  Aspirin- One aspirin daily can help prevent Heart disease and Stroke.  Flu shot- Every fall.  Tetanus- Every 10 years.  Zostavax- Once after the age of 35 to prevent Shingles.  Pneumonia shot- Once after the age of 16; if you are younger than  21, ask your healthcare provider if  you need a Pneumonia shot.  Take these steps  Don't smoke- If you do smoke, talk to your doctor about quitting.  For tips on how to quit, go to www.smokefree.gov or call 1-800-QUIT-NOW.  Be physically active- Exercise 5 days a week for at least 30 minutes.  If you are not already physically active start slow and gradually work up to 30 minutes of moderate physical activity.  Examples of moderate activity include walking briskly, mowing the yard, dancing, swimming, bicycling, etc.  Eat a healthy diet- Eat a variety of healthy food such as fruits, vegetables, low fat milk, low fat cheese, yogurt, lean meant, poultry, fish, beans, tofu, etc. For more information go to www.thenutritionsource.org  Drink alcohol in moderation- Limit alcohol intake to less than two drinks a day. Never drink and drive.  Dentist- Brush and floss twice daily; visit your dentist twice a year.  Depression- Your emotional health is as important as your physical health. If you're feeling down, or losing interest in things you would normally enjoy please talk to your healthcare provider.  Eye exam- Visit your eye doctor every year.  Safe sex- If you may be exposed to a sexually transmitted infection, use a condom.  Seat belts- Seat belts can save your life; always wear one.  Smoke/Carbon Monoxide detectors- These detectors need to be installed on the appropriate level of your home.  Replace batteries at least once a year.  Skin cancer- When out in the sun, cover up and use sunscreen 15 SPF or higher.  Violence- If anyone is threatening you, please tell your healthcare provider.  Living Will/ Health care power of attorney- Speak with your healthcare provider and family.  If you have lab work done today you will be contacted with your lab results within the next 2 weeks.  If you have not heard from [place] then please contact us. The fastest way to get your results is to register for My Chart.   IF you received an x-ray  today, you will receive an invoice from [facility]. Please contact [facility] at [phone] with questions or concerns regarding your invoice.   IF you received labwork today, you will receive an invoice from [place]. Please contact [facility] at [phone] with questions or concerns regarding your invoice.   Our billing staff will not be able to assist you with questions regarding bills from these companies.  You will be contacted with the lab results as soon as they are available. The fastest way to get your results is to activate your My Chart account. Instructions are located on the last page of this paperwork. If you have not heard from [place] regarding the results in 2 weeks, please contact this office.       I personally performed the services described in this documentation, which was scribed in my presence. The recorded information has been reviewed and considered for accuracy and completeness, addended by me as needed, and agree with information above.  Signed,   Merri Ray, MD [facility].  11/04/18 8:41 AM

## 2018-11-05 LAB — COMPREHENSIVE METABOLIC PANEL
ALT: 13 IU/L (ref 0–44)
AST: 17 IU/L (ref 0–40)
Albumin/Globulin Ratio: 2.1 (ref 1.2–2.2)
Albumin: 4.8 g/dL (ref 3.5–5.5)
Alkaline Phosphatase: 64 IU/L (ref 39–117)
BUN/Creatinine Ratio: 11 (ref 9–20)
BUN: 11 mg/dL (ref 6–24)
Bilirubin Total: 0.3 mg/dL (ref 0.0–1.2)
CALCIUM: 9.9 mg/dL (ref 8.7–10.2)
CO2: 27 mmol/L (ref 20–29)
Chloride: 103 mmol/L (ref 96–106)
Creatinine, Ser: 1.02 mg/dL (ref 0.76–1.27)
GFR, EST AFRICAN AMERICAN: 97 mL/min/{1.73_m2} (ref >59)
GFR, EST NON AFRICAN AMERICAN: 84 mL/min/{1.73_m2} (ref >59)
GLUCOSE: 67 mg/dL (ref 65–99)
Globulin, Total: 2.3 g/dL (ref 1.5–4.5)
Potassium: 4.3 mmol/L (ref 3.5–5.2)
Sodium: 143 mmol/L (ref 134–144)
TOTAL PROTEIN: 7.1 g/dL (ref 6.0–8.5)

## 2018-11-05 LAB — MAGNESIUM: Magnesium: 2.2 mg/dL (ref 1.6–2.3)

## 2018-11-05 LAB — LIPID PANEL
CHOL/HDL RATIO: 3.3 ratio (ref 0.0–5.0)
Cholesterol, Total: 161 mg/dL (ref 100–199)
HDL: 49 mg/dL (ref >39)
LDL Calculated: 96 mg/dL (ref 0–99)
Triglycerides: 81 mg/dL (ref 0–149)
VLDL CHOLESTEROL CAL: 16 mg/dL (ref 5–40)

## 2018-11-05 LAB — VITAMIN B12: Vitamin B-12: 454 pg/mL (ref 232–1245)

## 2018-11-05 LAB — VITAMIN D 25 HYDROXY (VIT D DEFICIENCY, FRACTURES): Vit D, 25-Hydroxy: 27.5 ng/mL — ABNORMAL LOW (ref 30.0–100.0)

## 2018-11-05 LAB — PSA: PROSTATE SPECIFIC AG, SERUM: 1.3 ng/mL (ref 0.0–4.0)

## 2018-11-05 LAB — FERRITIN: FERRITIN: 112 ng/mL (ref 30–400)

## 2019-01-28 IMAGING — RF DG C-ARM 61-120 MIN
1 series · 3 of 3 positions shown · non-contrast
Comparison: None.

CLINICAL DATA: 52-year-old male undergoing anterior cervical disc
arthroplasty at C4-C5

EXAM:
CERVICAL SPINE - 2-3 VIEW; DG C-ARM 61-120 MIN

[Series 1: run · 3 of 3 slices shown]
[im 1/3]
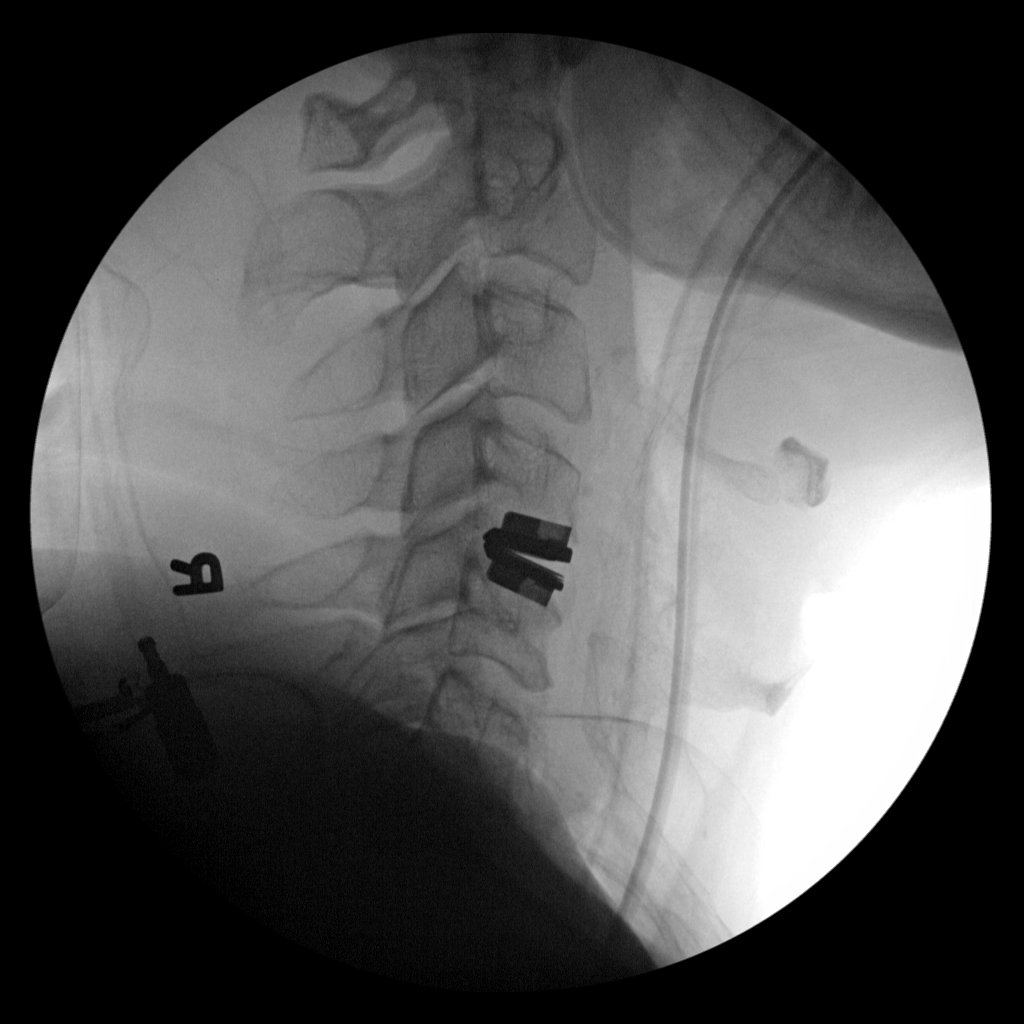
[im 2/3]
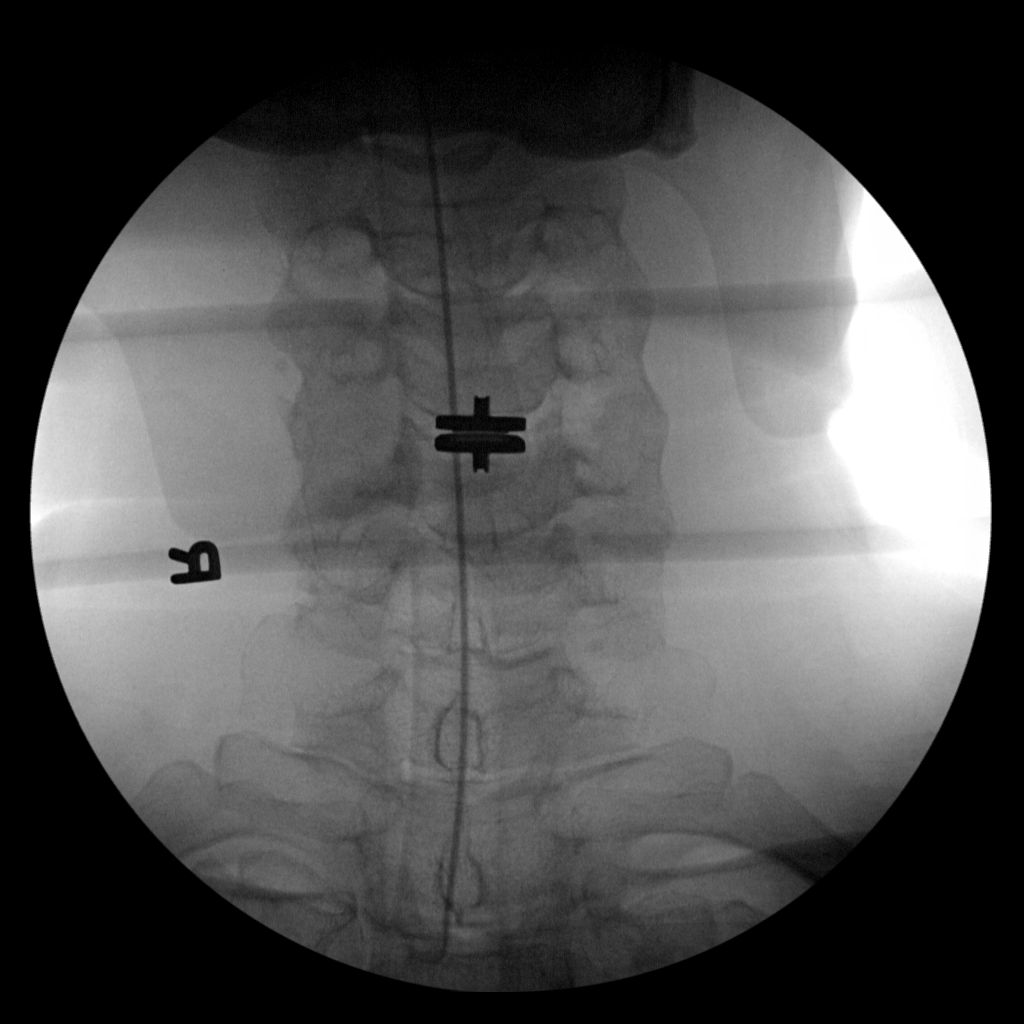
[im 3/3]
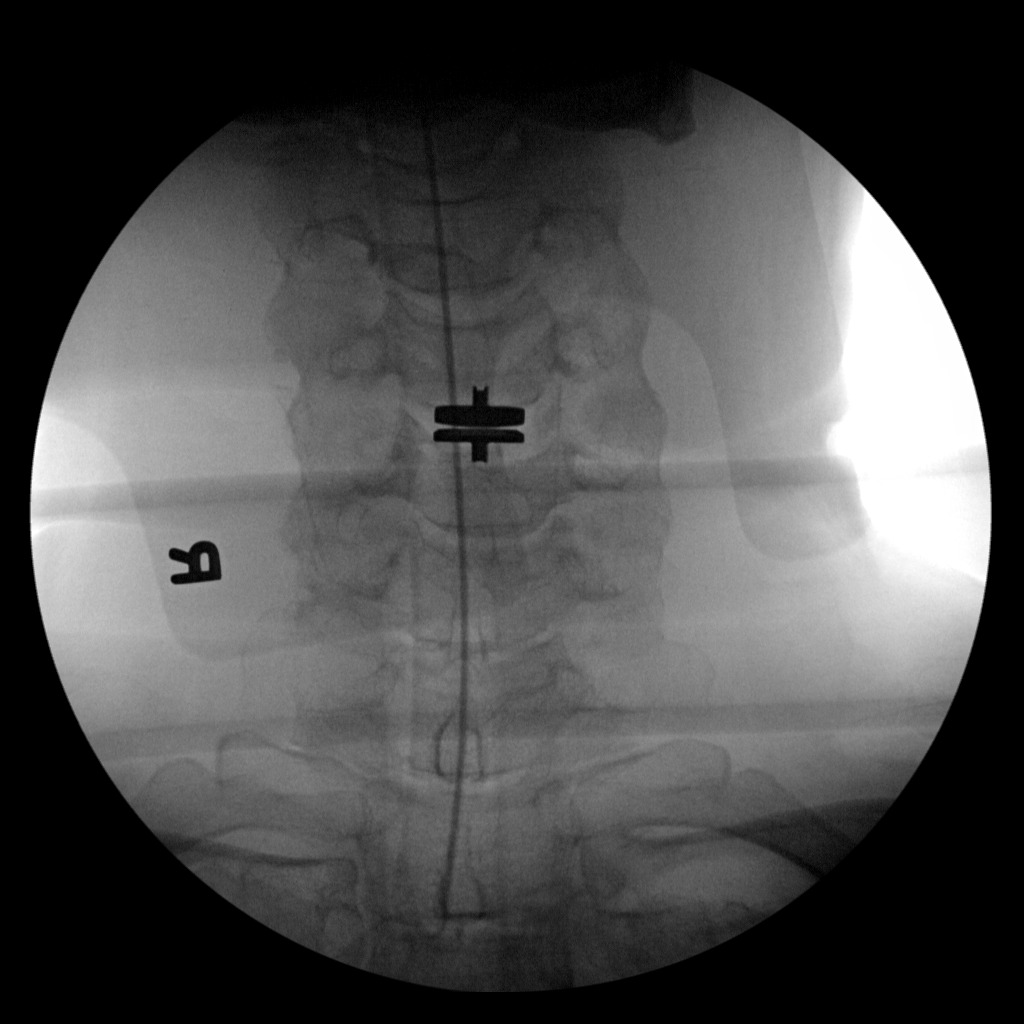

[3 of 3 positions shown; findings below may reference images not displayed]

FINDINGS: AP and cross-table lateral radiographs obtained and submitted for
review. The images demonstrate placement of an artificial disc at
C4-C5. The disc is well centered. Normal cervical alignment. The
patient is intubated. No immediate complication.
IMPRESSION: In progress anterior C4-C5 disc arthroplasty.

## 2019-11-05 ENCOUNTER — Telehealth: Payer: Self-pay | Admitting: Family Medicine

## 2019-11-05 NOTE — Telephone Encounter (Signed)
Patient is calling to cancel his appt on Monday 12/08/2019.  Please advise [phone]

## 2019-11-08 ENCOUNTER — Other Ambulatory Visit: Payer: Self-pay

## 2019-11-08 ENCOUNTER — Encounter: Payer: Self-pay | Admitting: Family Medicine

## 2019-11-08 ENCOUNTER — Encounter: Payer: Managed Care, Other (non HMO) | Admitting: Family Medicine

## 2019-11-08 ENCOUNTER — Ambulatory Visit: Payer: Managed Care, Other (non HMO) | Admitting: Family Medicine

## 2019-11-08 VITALS — BP 138/75 | HR 62 | Temp 98.4°F | Wt 165.5 lb

## 2019-11-08 DIAGNOSIS — D2262 Melanocytic nevi of left upper limb, including shoulder: Secondary | ICD-10-CM | POA: Diagnosis not present

## 2019-11-08 DIAGNOSIS — Z23 Encounter for immunization: Secondary | ICD-10-CM

## 2019-11-08 NOTE — Progress Notes (Signed)
Subjective:  Patient ID: Dustin Fischer, male    DOB: 11-13-1966  Age: 53 y.o. MRN: [id number]  CC:  Chief Complaint  Patient presents with  . skin lesion    on the left arm that seems to be chaning in the last month. Discoloration. Have other lesion but this one o  the left arm concerns me the most    HPI Dustin Fischer presents for   L arm lesion: Initially noted 7mo - 1 yr ago.  Feels like changed in past 2 months - taller, rougher. Change in colors.  No FH of skin CA known, but they have had some lesions removed. Has not seen dermatology recently - 4-5 yrs ago - prior benign lesions frozen/removed.  Has been using sunblock or protective clothing. Inside job, but fishing/gardening.    History Patient Active Problem List   Diagnosis Date Noted  . S/P cervical disc replacement 08/27/2018  . Gastroesophageal reflux disease without esophagitis 08/17/2016  . Bilateral low back pain without sciatica 08/17/2016  . Hearing loss 08/17/2016  . Basal cell carcinoma 08/17/2016   Past Medical History:  Diagnosis Date  . Arthritis    back   . Basal cell carcinoma, face   . GERD (gastroesophageal reflux disease)   . Ulcer of abdomen wall ([place])    childhood PUD   Past Surgical History:  Procedure Laterality Date  . CERVICAL DISC ARTHROPLASTY N/A 08/27/2018   Procedure: CERVICAL ANTERIOR [place] ARTHROPLASTY C4-5;  Surgeon: Melina Schools, MD;  Location: [place];  Service: Orthopedics;  Laterality: N/A;  2.5 hrs  . ESOPHAGEAL DILATION    . HERNIA REPAIR    . UPPER GASTROINTESTINAL ENDOSCOPY    . WISDOM TOOTH EXTRACTION     Allergies  Allergen Reactions  . Aspirin Other (See Comments)    Internal bleeding as a child  . Ibuprofen Other (See Comments)    Stomach ulcer   Prior to Admission medications   Medication Sig Start Date End Date Taking? Authorizing Provider  omeprazole (PRILOSEC OTC) 20 MG tablet Take 20 mg by mouth daily.   Yes [provider]   Social History    Socioeconomic History  . Marital status: Married    Spouse name: Not on file  . Number of children: Not on file  . Years of education: Not on file  . Highest education level: Not on file  Occupational History  . Not on file  Social Needs  . Financial resource strain: Not on file  . Food insecurity    Worry: Not on file    Inability: Not on file  . Transportation needs    Medical: Not on file    Non-medical: Not on file  Tobacco Use  . Smoking status: Never Smoker  . Smokeless tobacco: Never Used  Substance and Sexual Activity  . Alcohol use: No  . Drug use: No  . Sexual activity: Never  Lifestyle  . Physical activity    Days per week: Not on file    Minutes per session: Not on file  . Stress: Not on file  Relationships  . Social Herbalist on phone: Not on file    Gets together: Not on file    Attends religious service: Not on file    Active member of club or organization: Not on file    Attends meetings of clubs or organizations: Not on file    Relationship status: Not on file  . Intimate partner violence  Fear of current or ex partner: Not on file    Emotionally abused: Not on file    Physically abused: Not on file    Forced sexual activity: Not on file  Other Topics Concern  . 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   Objective:   Vitals:   11/08/19 1112  BP: 138/75  Pulse: 62  Temp: 98.4 F (36.9 C)  TempSrc: Oral  SpO2: 97%  Weight: 165 lb 8 oz (75.1 kg)     Physical Exam Vitals signs reviewed.  Constitutional:      General: He is not in acute  distress.    Appearance: He is well-developed.  HENT:     Head: Normocephalic and atraumatic.  Cardiovascular:     Rate and Rhythm: Normal rate.  Pulmonary:     Effort: Pulmonary effort is normal.  Skin:      Neurological:     Mental Status: He is alert and oriented to person, place, and time.        Assessment & Plan:  Dustin Fischer is a 53 y.o. male . Nevus of left forearm - Plan: Ambulatory referral to Dermatology  Need for prophylactic vaccination and inoculation against influenza - Plan: Flu Vaccine QUAD 6+ mos PF IM (Fluarix Quad PF)  Possible seborrheic keratosis, but with changes/progression will refer to dermatology for consideration of biopsy/excision.  Also recommend comprehensive exam with dermatology to evaluate other nevi given history of sun exposure..    No orders of the defined types were placed in this encounter.  Patient Instructions       If you have lab work done today you will be contacted with your lab results within the next 2 weeks.  If you have not heard from [place] then please contact us. The fastest way to get your results is to register for My Chart.   IF you received an x-ray today, you will receive an invoice from [facility]. Please contact [facility] at [phone] with questions or concerns regarding your invoice.   IF you received labwork today, you will receive an invoice from [place]. Please contact [facility] at [phone] with questions or concerns regarding your invoice.   Our billing staff will not be able to assist you with questions regarding bills from these companies.  You will be contacted with the lab results as soon as they are available. The fastest way to get your results is to activate your My Chart account. Instructions are located on the last page of this paperwork. If you have not heard from [place] regarding the results in 2 weeks, please contact this office.          Signed, Merri Ray, MD  [facility]

## 2019-11-08 NOTE — Patient Instructions (Addendum)
  I will refer you to dermatology but if you have not heard from them in the next 2 weeks, let me know.  Continue to wear sunblock on sun exposed areas or sun protective  clothing when out in the sun for prolonged periods.  Thank you for coming in today.    If you have lab work done today you will be contacted with your lab results within the next 2 weeks.  If you have not heard from [place] then please contact us. The fastest way to get your results is to register for My Chart.   IF you received an x-ray today, you will receive an invoice from [facility]. Please contact [facility] at [phone] with questions or concerns regarding your invoice.   IF you received labwork today, you will receive an invoice from [place]. Please contact [facility] at [phone] with questions or concerns regarding your invoice.   Our billing staff will not be able to assist you with questions regarding bills from these companies.  You will be contacted with the lab results as soon as they are available. The fastest way to get your results is to activate your My Chart account. Instructions are located on the last page of this paperwork. If you have not heard from [place] regarding the results in 2 weeks, please contact this office.

## 2019-11-09 DIAGNOSIS — Z23 Encounter for immunization: Secondary | ICD-10-CM

## 2020-09-27 ENCOUNTER — Encounter: Payer: Self-pay | Admitting: Registered Nurse

## 2020-09-27 ENCOUNTER — Other Ambulatory Visit: Payer: Self-pay

## 2020-09-27 ENCOUNTER — Ambulatory Visit: Payer: Managed Care, Other (non HMO) | Admitting: Registered Nurse

## 2020-09-27 VITALS — BP 113/72 | HR 62 | Temp 98.2°F | Resp 18 | Ht 73.0 in | Wt 171.4 lb

## 2020-09-27 DIAGNOSIS — T161XXA Foreign body in right ear, initial encounter: Secondary | ICD-10-CM | POA: Diagnosis not present

## 2020-09-27 NOTE — Progress Notes (Addendum)
Acute Office Visit  Subjective:    Patient ID: Dustin Fischer, male    DOB: 1966/06/30, 54 y.o.   MRN: [id number]  Chief Complaint  Patient presents with  . Ear Problem    Patient states he went to get hearing aid and [employer] and the lady told him that it was something stuck in right ear    HPI Patient is in today for foreign body in ear  Noted at [facility] when he went to pick up hearing aids this afternoon. No pain or drainage. No interference with his hearing beyond baseline. Notes that he didn't even know he had something in his ear.   Past Medical History:  Diagnosis Date  . Arthritis    back   . Basal cell carcinoma, face   . GERD (gastroesophageal reflux disease)   . Ulcer of abdomen wall ([place])    childhood PUD    Past Surgical History:  Procedure Laterality Date  . CERVICAL DISC ARTHROPLASTY N/A 08/27/2018   Procedure: CERVICAL ANTERIOR [place] ARTHROPLASTY C4-5;  Surgeon: Melina Schools, MD;  Location: [place];  Service: Orthopedics;  Laterality: N/A;  2.5 hrs  . ESOPHAGEAL DILATION    . HERNIA REPAIR    . UPPER GASTROINTESTINAL ENDOSCOPY    . WISDOM TOOTH EXTRACTION      Family History  Problem Relation Age of Onset  . Heart failure Mother   . Hyperlipidemia Mother   . Heart disease Mother 75       pacemaker/defibrillator  . Colon polyps Mother   . Hyperlipidemia Father   . Colon polyps Father   . Colon cancer Neg Hx   . Rectal cancer Neg Hx   . Stomach cancer Neg Hx   . Esophageal cancer Neg Hx     Social History   Socioeconomic History  . Marital status: Married    Spouse name: Not on file  . Number of children: Not on file  . Years of education: Not on file  . Highest education level: Not on file  Occupational History  . Not on file  Tobacco Use  . Smoking status: Never Smoker  . Smokeless tobacco: Never Used  Substance and Sexual Activity  . Alcohol use: No  . Drug use: No  . Sexual activity: Never  Other Topics Concern  . Not on file  Social  History Narrative   Marital status: married x 28 years      Children: 3 children; no grandchildren      Lives: with wife      Employment:  Sales x 27 years; [employer], [employer]; [employer]. Mostly [place] travel      Tobacco: none       Alcohol: none       Exercises: jogging 3-4 miles 3-4 times per week      Seatbelt: 100%; some texting while driving                Social Determinants of Health   Financial Resource Strain:   . Difficulty of Paying Living Expenses: Not on file  Food Insecurity:   . Worried About Charity fundraiser in the Last Year: Not on file  . Ran Out of Food in the Last Year: Not on file  Transportation Needs:   . Lack of Transportation (Medical): Not on file  . Lack of Transportation (Non-Medical): Not on file  Physical Activity:   . Days of Exercise per Week: Not on file  . Minutes of Exercise  per Session: Not on file  Stress:   . Feeling of Stress : Not on file  Social Connections:   . Frequency of Communication with Friends and Family: Not on file  . Frequency of Social Gatherings with Friends and Family: Not on file  . Attends Religious Services: Not on file  . Active Member of Clubs or Organizations: Not on file  . Attends Archivist Meetings: Not on file  . Marital Status: Not on file  Intimate Partner Violence:   . Fear of Current or Ex-Partner: Not on file  . Emotionally Abused: Not on file  . Physically Abused: Not on file  . Sexually Abused: Not on file    Outpatient Medications Prior to Visit  Medication Sig Dispense Refill  . omeprazole (PRILOSEC OTC) 20 MG tablet Take 20 mg by mouth daily.     No facility-administered medications prior to visit.    Allergies  Allergen Reactions  . Aspirin Other (See Comments)    Internal bleeding as a child  . Ibuprofen Other (See Comments)    Stomach ulcer    Review of Systems  Constitutional: Negative.   HENT: Negative.   Eyes: Negative.   Respiratory: Negative.     Cardiovascular: Negative.   Gastrointestinal: Negative.   Genitourinary: Negative.   Musculoskeletal: Negative.   Skin: Negative.   Neurological: Negative.   Psychiatric/Behavioral: Negative.        Objective:    Physical Exam Vitals and nursing note reviewed.  Constitutional:      Appearance: Normal appearance.  HENT:     Right Ear: Tympanic membrane, ear canal and external ear normal. There is no impacted cerumen.     Left Ear: Tympanic membrane, ear canal and external ear normal. There is no impacted cerumen.     Ears:     Comments: Foreign body - clear plastic looking object in R canal. Obscures view of TM  After removal of object, TM seen clearly. WNL. Cardiovascular:     Rate and Rhythm: Normal rate and regular rhythm.  Neurological:     General: No focal deficit present.     Mental Status: He is alert and oriented to person, place, and time. Mental status is at baseline.  Psychiatric:        Mood and Affect: Mood normal.        Behavior: Behavior normal.        Thought Content: Thought content normal.        Judgment: Judgment normal.     BP 113/72   Pulse 62   Temp 98.2 F (36.8 C) (Temporal)   Resp 18   Ht 6\' 1"  (1.854 m)   Wt 171 lb 6.4 oz (77.7 kg)   SpO2 98%   BMI 22.61 kg/m  Wt Readings from Last 3 Encounters:  09/27/20 171 lb 6.4 oz (77.7 kg)  11/08/19 165 lb 8 oz (75.1 kg)  11/04/18 175 lb 3.2 oz (79.5 kg)    Health Maintenance Due  Topic Date Due  . Hepatitis C Screening  Never done    There are no preventive care reminders to display for this patient.   Lab Results  Component Value Date   TSH 3.14 08/17/2016   Lab Results  Component Value Date   WBC 5.8 08/19/2018   HGB 14.3 08/19/2018   HCT 43.8 08/19/2018   MCV 97.8 08/19/2018   PLT 246 08/19/2018   Lab Results  Component Value Date   NA 143 11/04/2018  K 4.3 11/04/2018   CO2 27 11/04/2018   GLUCOSE 67 11/04/2018   BUN 11 11/04/2018   CREATININE 1.02 11/04/2018    BILITOT 0.3 11/04/2018   ALKPHOS 64 11/04/2018   AST 17 11/04/2018   ALT 13 11/04/2018   PROT 7.1 11/04/2018   ALBUMIN 4.8 11/04/2018   CALCIUM 9.9 11/04/2018   ANIONGAP 7 08/19/2018   Lab Results  Component Value Date   CHOL 161 11/04/2018   Lab Results  Component Value Date   HDL 49 11/04/2018   Lab Results  Component Value Date   LDLCALC 96 11/04/2018   Lab Results  Component Value Date   TRIG 81 11/04/2018   Lab Results  Component Value Date   CHOLHDL 3.3 11/04/2018   Lab Results  Component Value Date   HGBA1C 5.5 08/17/2016       Assessment & Plan:   Problem List Items Addressed This Visit    None    Visit Diagnoses    Foreign body of right ear, initial encounter    -  Primary       No orders of the defined types were placed in this encounter.  PLAN  Removed piece of ear plug with alligator forceps. Exam wnl following removal  No complaints from patient, he is feeling well  Patient encouraged to call clinic with any questions, comments, or concerns.  Maximiano Coss, NP

## 2020-09-27 NOTE — Patient Instructions (Signed)
° ° ° °  If you have lab work done today you will be contacted with your lab results within the next 2 weeks.  If you have not heard from us then please contact us. The fastest way to get your results is to register for My Chart. ° ° °IF you received an x-ray today, you will receive an invoice from [facility]. Please contact [facility] at [phone] with questions or concerns regarding your invoice.  ° °IF you received labwork today, you will receive an invoice from [facility]. Please contact [facility] at [phone] with questions or concerns regarding your invoice.  ° °Our billing staff will not be able to assist you with questions regarding bills from these companies. ° °You will be contacted with the lab results as soon as they are available. The fastest way to get your results is to activate your My Chart account. Instructions are located on the last page of this paperwork. If you have not heard from us regarding the results in 2 weeks, please contact this office. °  ° ° ° °
